# Patient Record
Sex: Female | Born: 1995
Health system: Southern US, Community
[De-identification: ages and names within clinical notes are randomized; demographics above are authoritative.]

## PROBLEM LIST (undated history)

## (undated) HISTORY — PX: NO PAST SURGERIES: SHX2092

---
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
## 2015-05-31 ENCOUNTER — Other Ambulatory Visit: Payer: Self-pay | Admitting: Family Medicine

## 2015-05-31 DIAGNOSIS — Z3491 Encounter for supervision of normal pregnancy, unspecified, first trimester: Secondary | ICD-10-CM

## 2015-06-04 ENCOUNTER — Ambulatory Visit
Admission: RE | Admit: 2015-06-04 | Discharge: 2015-06-04 | Disposition: A | Discharge status: Home / Self Care | Payer: Medicaid Other | Source: Ambulatory Visit | Attending: Family Medicine | Admitting: Family Medicine

## 2015-06-04 DIAGNOSIS — Z3A19 19 weeks gestation of pregnancy: Secondary | ICD-10-CM | POA: Insufficient documentation

## 2015-06-04 DIAGNOSIS — Z3481 Encounter for supervision of other normal pregnancy, first trimester: Secondary | ICD-10-CM | POA: Diagnosis present

## 2015-06-04 DIAGNOSIS — Z3491 Encounter for supervision of normal pregnancy, unspecified, first trimester: Secondary | ICD-10-CM

## 2015-09-15 ENCOUNTER — Other Ambulatory Visit: Payer: Self-pay | Admitting: Family Medicine

## 2015-09-15 DIAGNOSIS — O365930 Maternal care for other known or suspected poor fetal growth, third trimester, not applicable or unspecified: Secondary | ICD-10-CM

## 2015-09-16 ENCOUNTER — Ambulatory Visit
Admission: RE | Admit: 2015-09-16 | Discharge: 2015-09-16 | Disposition: A | Discharge status: Home / Self Care | Payer: Medicaid Other | Source: Ambulatory Visit | Attending: Family Medicine | Admitting: Family Medicine

## 2015-09-16 DIAGNOSIS — Z3A34 34 weeks gestation of pregnancy: Secondary | ICD-10-CM | POA: Diagnosis not present

## 2015-09-16 DIAGNOSIS — O365930 Maternal care for other known or suspected poor fetal growth, third trimester, not applicable or unspecified: Secondary | ICD-10-CM | POA: Diagnosis present

## 2016-05-08 NOTE — L&D Delivery Note (Addendum)
Date of delivery: 09/21/16 Estimated Date of Delivery: 09/26/16 LMP 12/20/15 estimated EGA: 7769w2d  Delivery Note At 2:57 PM a viable female was delivered via Vaginal, Spontaneous Delivery (Presentation: direct OA ).  APGAR: 8, 9; weight 7lb 5oz, 3330g .   Placenta status: spontaneous, intact .  Cord: 3 vessels   with the following complications: none apparent.  Cord pH: not collected  Anesthesia:  none Episiotomy:  none Lacerations:  none Suture Repair: n/a Est. Blood Loss (mL):  200cc  Mom presented to L&D with advanced labor.  Progressed to complete, second stage: short.  delivery of fetal head with restitution to ROT.   Anterior then posterior shoulders delivered without difficulty.  Baby placed on mom's chest, and attended to by peds.  We sang happy birthday to baby Jaylene.  Cord was then clamped and cut when pulseless.  Placenta spontaneously delivered, intact.   IV pitocin given for hemorrhage prophylaxis.  Mom to postpartum.  Baby to Couplet care / Skin to Skin.  ----- Ranae Plumberhelsea Cierria Height, MD Attending Obstetrician and Gynecologist [facility], Department of OB/GYN [facility]

## 2016-07-17 ENCOUNTER — Other Ambulatory Visit: Payer: Self-pay | Admitting: Family Medicine

## 2016-07-17 DIAGNOSIS — Z331 Pregnant state, incidental: Secondary | ICD-10-CM

## 2016-07-21 ENCOUNTER — Ambulatory Visit
Admission: RE | Admit: 2016-07-21 | Discharge: 2016-07-21 | Disposition: A | Discharge status: Home / Self Care | Payer: Medicaid Other | Source: Ambulatory Visit | Attending: Family Medicine | Admitting: Family Medicine

## 2016-07-21 DIAGNOSIS — Z3A30 30 weeks gestation of pregnancy: Secondary | ICD-10-CM | POA: Insufficient documentation

## 2016-07-21 DIAGNOSIS — Z3689 Encounter for other specified antenatal screening: Secondary | ICD-10-CM | POA: Insufficient documentation

## 2016-07-21 DIAGNOSIS — Z331 Pregnant state, incidental: Secondary | ICD-10-CM

## 2016-09-21 ENCOUNTER — Inpatient Hospital Stay
Admission: EM | Admit: 2016-09-21 | Discharge: 2016-09-22 | DRG: 775 | Disposition: A | Discharge status: Home / Self Care | Payer: Medicaid Other | Attending: Obstetrics & Gynecology | Admitting: Obstetrics & Gynecology

## 2016-09-21 ENCOUNTER — Encounter: Payer: Self-pay | Admitting: *Deleted

## 2016-09-21 DIAGNOSIS — Z3A39 39 weeks gestation of pregnancy: Secondary | ICD-10-CM | POA: Diagnosis not present

## 2016-09-21 DIAGNOSIS — Z3493 Encounter for supervision of normal pregnancy, unspecified, third trimester: Secondary | ICD-10-CM | POA: Diagnosis present

## 2016-09-21 LAB — CBC WITH DIFFERENTIAL/PLATELET
BASOS ABS: 0 10*3/uL (ref 0–0.1)
Basophils Relative: 0 %
Eosinophils Absolute: 0 10*3/uL (ref 0–0.7)
Eosinophils Relative: 0 %
HEMATOCRIT: 33.9 % — AB (ref 35.0–47.0)
Hemoglobin: 11.5 g/dL — ABNORMAL LOW (ref 12.0–16.0)
LYMPHS PCT: 5 %
Lymphs Abs: 0.5 10*3/uL — ABNORMAL LOW (ref 1.0–3.6)
MCH: 29.2 pg (ref 26.0–34.0)
MCHC: 33.9 g/dL (ref 32.0–36.0)
MCV: 86.2 fL (ref 80.0–100.0)
MONO ABS: 0.3 10*3/uL (ref 0.2–0.9)
MONOS PCT: 2 %
NEUTROS ABS: 10.6 10*3/uL — AB (ref 1.4–6.5)
Neutrophils Relative %: 93 %
Platelets: 297 10*3/uL (ref 150–440)
RBC: 3.93 MIL/uL (ref 3.80–5.20)
RDW: 13.4 % (ref 11.5–14.5)
WBC: 11.4 10*3/uL — ABNORMAL HIGH (ref 3.6–11.0)

## 2016-09-21 LAB — CHLAMYDIA/NGC RT PCR (ARMC ONLY)
Chlamydia Tr: NOT DETECTED
N GONORRHOEAE: NOT DETECTED

## 2016-09-21 LAB — TYPE AND SCREEN
ABO/RH(D): A POS
Antibody Screen: NEGATIVE

## 2016-09-21 MED ORDER — OXYTOCIN 40 UNITS IN LACTATED RINGERS INFUSION - SIMPLE MED
INTRAVENOUS | Status: AC
  Filled 2016-09-21: qty 1000

## 2016-09-21 MED ORDER — COCONUT OIL OIL: 1.0000 "application " | TOPICAL_OIL | Status: DC | PRN

## 2016-09-21 MED ORDER — IBUPROFEN 600 MG PO TABS
600.0000 mg | ORAL_TABLET | Freq: Four times a day (QID) | ORAL | Status: DC
  Administered 2016-09-21 – 2016-09-22 (×4): 600 mg via ORAL

## 2016-09-21 MED ORDER — DIPHENHYDRAMINE HCL 25 MG PO CAPS: 25.0000 mg | ORAL_CAPSULE | Freq: Four times a day (QID) | ORAL | Status: DC | PRN

## 2016-09-21 MED ORDER — ONDANSETRON HCL 4 MG/2ML IJ SOLN: 4.0000 mg | INTRAMUSCULAR | Status: DC | PRN

## 2016-09-21 MED ORDER — OXYCODONE-ACETAMINOPHEN 5-325 MG PO TABS: 2.0000 | ORAL_TABLET | ORAL | Status: DC | PRN

## 2016-09-21 MED ORDER — LACTATED RINGERS IV SOLN: 500.0000 mL | INTRAVENOUS | Status: DC | PRN

## 2016-09-21 MED ORDER — METHYLERGONOVINE MALEATE 0.2 MG/ML IJ SOLN: 0.2000 mg | Freq: Once | INTRAMUSCULAR | Status: DC

## 2016-09-21 MED ORDER — IBUPROFEN 600 MG PO TABS
ORAL_TABLET | ORAL | Status: AC
  Filled 2016-09-21: qty 1

## 2016-09-21 MED ORDER — METHYLERGONOVINE MALEATE 0.2 MG/ML IJ SOLN
INTRAMUSCULAR | Status: AC
  Administered 2016-09-21: 15:00:00 via INTRAMUSCULAR
  Filled 2016-09-21: qty 1

## 2016-09-21 MED ORDER — DOCUSATE SODIUM 100 MG PO CAPS
100.0000 mg | ORAL_CAPSULE | Freq: Two times a day (BID) | ORAL | Status: DC
  Administered 2016-09-21 – 2016-09-22 (×2): 100 mg via ORAL
  Filled 2016-09-21 (×2): qty 1

## 2016-09-21 MED ORDER — ONDANSETRON HCL 4 MG/2ML IJ SOLN: 4.0000 mg | Freq: Four times a day (QID) | INTRAMUSCULAR | Status: DC | PRN

## 2016-09-21 MED ORDER — SOD CITRATE-CITRIC ACID 500-334 MG/5ML PO SOLN
30.0000 mL | ORAL | Status: DC | PRN
  Filled 2016-09-21: qty 30

## 2016-09-21 MED ORDER — OXYCODONE-ACETAMINOPHEN 5-325 MG PO TABS: 1.0000 | ORAL_TABLET | ORAL | Status: DC | PRN

## 2016-09-21 MED ORDER — HYDROCORTISONE 2.5 % RE CREA
TOPICAL_CREAM | RECTAL | Status: DC | PRN
  Filled 2016-09-21: qty 28.35

## 2016-09-21 MED ORDER — ACETAMINOPHEN 325 MG PO TABS: 650.0000 mg | ORAL_TABLET | ORAL | Status: DC | PRN

## 2016-09-21 MED ORDER — LIDOCAINE HCL (PF) 1 % IJ SOLN
INTRAMUSCULAR | Status: AC
  Filled 2016-09-21: qty 30

## 2016-09-21 MED ORDER — LIDOCAINE HCL (PF) 1 % IJ SOLN: 30.0000 mL | INTRAMUSCULAR | Status: DC | PRN

## 2016-09-21 MED ORDER — MISOPROSTOL 200 MCG PO TABS
ORAL_TABLET | ORAL | Status: AC
  Filled 2016-09-21: qty 4

## 2016-09-21 MED ORDER — WITCH HAZEL-GLYCERIN EX PADS: 1.0000 "application " | MEDICATED_PAD | CUTANEOUS | Status: DC | PRN

## 2016-09-21 MED ORDER — ONDANSETRON HCL 4 MG PO TABS: 4.0000 mg | ORAL_TABLET | ORAL | Status: DC | PRN

## 2016-09-21 MED ORDER — PRENATAL MULTIVITAMIN CH
1.0000 | ORAL_TABLET | Freq: Every day | ORAL | Status: DC
  Administered 2016-09-22: 1 via ORAL
  Filled 2016-09-21: qty 1

## 2016-09-21 MED ORDER — AMMONIA AROMATIC IN INHA
RESPIRATORY_TRACT | Status: AC
  Filled 2016-09-21: qty 10

## 2016-09-21 MED ORDER — ACETAMINOPHEN 500 MG PO TABS: 1000.0000 mg | ORAL_TABLET | Freq: Four times a day (QID) | ORAL | Status: DC | PRN

## 2016-09-21 MED ORDER — BENZOCAINE-MENTHOL 20-0.5 % EX AERO: 1.0000 "application " | INHALATION_SPRAY | CUTANEOUS | Status: DC | PRN

## 2016-09-21 MED ORDER — LACTATED RINGERS IV SOLN
INTRAVENOUS | Status: DC
  Administered 2016-09-21: 1000 mL via INTRAVENOUS

## 2016-09-21 MED ORDER — OXYTOCIN 40 UNITS IN LACTATED RINGERS INFUSION - SIMPLE MED: 2.5000 [IU]/h | INTRAVENOUS | Status: DC

## 2016-09-21 MED ORDER — LACTATED RINGERS IV SOLN: INTRAVENOUS | Status: DC

## 2016-09-21 MED ORDER — IBUPROFEN 600 MG PO TABS
600.0000 mg | ORAL_TABLET | Freq: Four times a day (QID) | ORAL | Status: DC | PRN
  Administered 2016-09-21: 600 mg via ORAL
  Filled 2016-09-21 (×4): qty 1

## 2016-09-21 MED ORDER — SIMETHICONE 80 MG PO CHEW: 80.0000 mg | CHEWABLE_TABLET | ORAL | Status: DC | PRN

## 2016-09-21 MED ORDER — OXYTOCIN BOLUS FROM INFUSION
500.0000 mL | Freq: Once | INTRAVENOUS | Status: AC
  Administered 2016-09-21: 500 mL via INTRAVENOUS

## 2016-09-21 NOTE — OB Triage Note (Signed)
Here witht complaint of contracitonns that started at 4am

## 2016-09-21 NOTE — H&P (Signed)
OB History & Physical   Written after delivery  History of Present Illness:  Chief Complaint:   HPI:  Kaitlin Parks is a 21 y.o. G2P1001 female at 6128w2d dated by LMP per patient    She presents to L&D with LABOR  +FM, + CTX, no LOF, no VB   RECORDS NOT AVAILABLE AT TIME OF ADMISSION   Maternal Medical History:  History reviewed. No pertinent past medical history.  History reviewed. No pertinent surgical history.  No Known Allergies  Prior to Admission medications   Not on File     Prenatal care site:  [facility]   Social History: She  reports that she has never smoked. She does not have any smokeless tobacco history on file. She reports that she does not drink alcohol or use drugs.  Family History: no gyn cancers  Review of Systems: A full review of systems was performed and negative except as noted in the HPI.     Physical Exam:  Vital Signs: BP 133/76 (BP Location: Left Arm)   Pulse (!) 110   Temp 98 F (36.7 C) (Oral)   Resp 18   Ht 5' (1.524 m)   Wt 58.5 kg (129 lb)   BMI 25.19 kg/m  General: no acute distress.  HEENT: normocephalic, atraumatic Heart: regular rate & rhythm.  No murmurs/rubs/gallops Lungs: clear to auscultation bilaterally, normal respiratory effort Abdomen: soft, gravid, non-tender;  EFW: 6.5lbs Pelvic:   External: Normal external female genitalia  Cervix: Dilation: Lip/rim / Effacement (%): 100 / Station: 0    Extremities: non-tender, symmetric, no edema bilaterally.  DTRs: 2+  Neurologic: Alert & oriented x 3.    No results found for this or any previous visit (from the past 24 hour(s)).  Pertinent Results:  Prenatal Labs: UNKNOWN AT TIME OF ADMISSION/DELIVERY  FHT: 145 mod + accels no decels TOCO: q2-174min SVE:  Dilation: Lip/rim / Effacement (%): 100 / Station: 0    Cephalic by leopolds    Assessment:  Kaitlin Parks is a 21 y.o. G2P1001 female at 4928w2d with labor.   Plan:  1. Admit to Labor  & Delivery 2. CBC, T&S, Clrs, IVF 3. GBS unknown, no risk factors.  4. Consents obtained. 5. Continuous efm/toco 6. Delivery imminent.  ----- Ranae Plumberhelsea Nakisha Chai, MD Attending Obstetrician and Gynecologist [facility], Department of OB/GYN [facility]

## 2016-09-22 LAB — CBC
HEMATOCRIT: 29.4 % — AB (ref 35.0–47.0)
HEMOGLOBIN: 10 g/dL — AB (ref 12.0–16.0)
MCH: 29.5 pg (ref 26.0–34.0)
MCHC: 33.9 g/dL (ref 32.0–36.0)
MCV: 87.1 fL (ref 80.0–100.0)
Platelets: 273 10*3/uL (ref 150–440)
RBC: 3.38 MIL/uL — AB (ref 3.80–5.20)
RDW: 13.6 % (ref 11.5–14.5)
WBC: 8.8 10*3/uL (ref 3.6–11.0)

## 2016-09-22 LAB — RPR: RPR: NONREACTIVE

## 2016-09-22 MED ORDER — MEASLES, MUMPS & RUBELLA VAC ~~LOC~~ INJ
0.5000 mL | INJECTION | Freq: Once | SUBCUTANEOUS | Status: AC
  Administered 2016-09-22: 0.5 mL via SUBCUTANEOUS
  Filled 2016-09-22: qty 0.5

## 2016-09-22 NOTE — Progress Notes (Signed)
Patient discharged home with infant and significant other. Discharge instructions, prescriptions and follow up appointment given to and reviewed with patient and significant other. Patient verbalized understanding. Escorted out via wheelchair by nursing staff.  

## 2016-09-22 NOTE — Progress Notes (Signed)
Post Partum Day 1 Subjective: I want to go home today  Objective: Blood pressure (!) 92/54, pulse 66, temperature 98 F (36.7 C), temperature source Oral, resp. rate 17, height 5' (1.524 m), weight 129 lb (58.5 kg), SpO2 99 %, unknown if currently breastfeeding.  Physical Exam:  General: alert, cooperative and appears stated age  Heart:S1S2, RRR, No M/R/G. Lungs:CTA bilat no W/R/R. Lochia: appropriate Uterine Fundus: firm DVT Evaluation: No evidence of DVT seen on physical exam.   Recent Labs  09/21/16 1417 09/22/16 0532  HGB 11.5* 10.0*  HCT 33.9* 29.4*  WBC 11.4* 8.8  PLT 297 273    Assessment/Plan: Discharge home   LOS: 1 day   Sharee Pimplearon W Daiel Strohecker 09/22/2016, 8:48 AM

## 2016-09-22 NOTE — Discharge Summary (Signed)
Obstetric Discharge Summary   Patient ID: Kaitlin Parks MRN: [id number] DOB/AGE: May 01, 1996 21 y.o.   Date of Admission: 09/21/2016  Date of Discharge:  09/22/16 Admitting Diagnosis: Onset of Labor at 2268w2d  Secondary Diagnosis: NOne  Mode of Delivery: normal spontaneous vaginal delivery     Discharge Diagnosis: Term delivery   Intrapartum Procedures: GBS neg,    Post partum procedures:None  Complications: None   Brief Hospital Course  Myrlene Brokerlejandra Bradly BienenstockMartinez is a [id number] who had a SVD on 09/21/16  for further details of this delivery, please refer to the delivery note.  Patient had an uncomplicated postpartum course.  By time of discharge on PPD#1 her pain was controlled on oral pain medications; she had appropriate lochia and was ambulating, voiding without difficulty and tolerating regular diet.  She was deemed stable for discharge to home.     Labs: CBC Latest Ref Rng & Units 09/22/2016 09/21/2016  WBC 3.6 - 11.0 K/uL 8.8 11.4(H)  Hemoglobin 12.0 - 16.0 g/dL 10.0(L) 11.5(L)  Hematocrit 35.0 - 47.0 % 29.4(L) 33.9(L)  Platelets 150 - 440 K/uL 273 297   A POS  Physical exam:  Blood pressure (!) 92/54, pulse 66, temperature 98 F (36.7 C), temperature source Oral, resp. rate 17, height 5' (1.524 m), weight 129 lb (58.5 kg), SpO2 99 %, unknown if currently breastfeeding. General: alert and no distress Heart: S1S2, RRR, No M/R/G. Lochia: appropriate Abdomen: soft, NT Uterine Fundus: firm Extremities: No evidence of DVT seen on physical exam. No lower extremity edema.  Discharge Instructions: Per After Visit Summary. Activity: Advance as tolerated. Pelvic rest for 6 weeks.  Also refer to After Visit Summary Diet: Regular Medications: Allergies as of 09/22/2016   No Known Allergies     Medication List    You have not been prescribed any medications.    Outpatient follow up: 6 weeks [facility] Postpartum contraception:at 6 weeks, pt undecided  Discharged Condition:  none  Discharged to: home   Newborn Data:  Baby Girl   Disposition:home with mother  Apgars: APGAR (1 MIN): 8   APGAR (5 MINS): 9   APGAR (10 MINS):    Sharee PimpleCaron W Audrey Thull, CNM 09/22/2016

## 2017-08-09 ENCOUNTER — Ambulatory Visit (INDEPENDENT_AMBULATORY_CARE_PROVIDER_SITE_OTHER): Payer: Medicaid Other | Admitting: Physician Assistant

## 2017-08-09 ENCOUNTER — Encounter: Payer: Self-pay | Admitting: Physician Assistant

## 2017-08-09 VITALS — BP 104/60 | HR 60 | Temp 98.5°F | Resp 16 | Ht 60.0 in | Wt 130.0 lb

## 2017-08-09 DIAGNOSIS — Z1329 Encounter for screening for other suspected endocrine disorder: Secondary | ICD-10-CM

## 2017-08-09 DIAGNOSIS — D649 Anemia, unspecified: Secondary | ICD-10-CM | POA: Diagnosis not present

## 2017-08-09 DIAGNOSIS — Z131 Encounter for screening for diabetes mellitus: Secondary | ICD-10-CM | POA: Diagnosis not present

## 2017-08-09 DIAGNOSIS — R079 Chest pain, unspecified: Secondary | ICD-10-CM | POA: Diagnosis not present

## 2017-08-09 DIAGNOSIS — Z13220 Encounter for screening for lipoid disorders: Secondary | ICD-10-CM

## 2017-08-09 NOTE — Progress Notes (Signed)
Patient: Kaitlin Parks Female    DOB: 1995/08/24   22 y.o.   MRN: [id number] Visit Date: 08/09/2017  Today's Provider: Trey SailorsAdriana M Artemisa Sladek, PA-C   Chief Complaint  Patient presents with  . Establish Care  . Chest Pain   Subjective:    Kaitlin Nailerlejandra Yureli Snellings is a 22 y/o woman presenting today to establish care. 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 She is living in [place] with her parents. She has two children, ages 2 and 1. She has been in a relationship for four years, sexually active. She has a mirena IUD and does not have concern for STI.  She reports having a PAP one or two years ago with one of her pregnancies.  She has had chest pain in the past, pertinent info below.  She reports her mom had a lump removed from her breast which was not cancerous, but that she is taking medication so that it does not turn into cancer.  Chest Pain   This is a recurrent problem. The current episode started 1 to 4 weeks ago (Happened 07/15/2017; [facility]  She states they did an EKG and it was normal.  She was prescribed Naproxen 500mg  which helped with the pain.). The problem has been resolved. The pain does not radiate. Pertinent negatives include no abdominal pain, back pain, claudication, cough, diaphoresis, dizziness, exertional chest pressure, fever, headaches, hemoptysis, irregular heartbeat, leg pain, lower extremity edema, malaise/fatigue, nausea, near-syncope, numbness, orthopnea, palpitations, PND, shortness of breath, sputum production, syncope, vomiting or weakness.       No Known Allergies  No current outpatient medications on file.  Review of Systems  Constitutional: Negative.  Negative for diaphoresis, fever and malaise/fatigue.  HENT: Negative.   Eyes: Negative.   Respiratory: Negative.  Negative for cough, hemoptysis, sputum production and shortness of breath.   Cardiovascular: Positive for chest pain. Negative for palpitations,  orthopnea, claudication, syncope, PND and near-syncope.  Gastrointestinal: Negative.  Negative for abdominal pain, nausea and vomiting.  Endocrine: Negative.   Genitourinary: Negative.   Musculoskeletal: Negative.  Negative for back pain.  Skin: Negative.   Allergic/Immunologic: Negative.   Neurological: Negative.  Negative for dizziness, weakness, numbness and headaches.  Hematological: Negative.   Psychiatric/Behavioral: Negative.    Family History  Problem Relation Age of Onset  . Healthy Mother   . Healthy Father    Past Surgical History:  Procedure Laterality Date  . NO PAST SURGERIES      Social History   Tobacco Use  . Smoking status: Never Smoker  . Smokeless tobacco: Never Used  Substance Use Topics  . Alcohol use: No   Objective:   BP 104/60 (BP Location: Right Arm, Patient Position: Sitting, Cuff Size: Normal)   Pulse 60   Temp 98.5 F (36.9 C) (Oral)   Resp 16   Ht 5' (1.524 m)   Wt 130 lb (59 kg)   BMI 25.39 kg/m  Vitals:   08/09/17 0919  BP: 104/60  Pulse: 60  Resp: 16  Temp: 98.5 F (36.9 C)  TempSrc: Oral  Weight: 130 lb (59 kg)  Height: 5' (1.524 m)     Physical Exam  Constitutional: She is oriented to person, place, and time. She appears well-developed and well-nourished.  Cardiovascular: Normal rate and regular rhythm.  Pulmonary/Chest: Effort normal and breath sounds normal.  Abdominal: Soft. Bowel sounds are normal.  Neurological: She is alert and oriented to  person, place, and time.  Skin: Skin is warm and dry.  Psychiatric: She has a normal mood and affect. Her behavior is normal.        Assessment & Plan:     1. Chest pain, unspecified type  Think this is likely MSK chest pain due to infrequent occurrences and resolution with Naproxen. She can monitor this for now, I can refer to cardiology for reassurance if she desires but I think this is unlikely to be cardiac etiology.  2. Anemia, unspecified type  - CBC With  Differential  3. Thyroid disorder screening  - TSH  4. Diabetes mellitus screening  - Comprehensive Metabolic Panel (CMET)  5. Screening cholesterol level  - Lipid Profile  Return in about 1 year (around 08/10/2018) for CPE.  The entirety of the information documented in the History of Present Illness, Review of Systems and Physical Exam were personally obtained by me. Portions of this information were initially documented by Kavin Leech, CMA and reviewed by me for thoroughness and accuracy.         Trey Sailors, PA-C  [facility]

## 2017-08-09 NOTE — Patient Instructions (Signed)

## 2017-08-10 LAB — COMPREHENSIVE METABOLIC PANEL
ALT: 10 IU/L (ref 0–32)
AST: 11 IU/L (ref 0–40)
Albumin/Globulin Ratio: 1.5 (ref 1.2–2.2)
Albumin: 4.5 g/dL (ref 3.5–5.5)
Alkaline Phosphatase: 86 IU/L (ref 39–117)
BUN/Creatinine Ratio: 24 — ABNORMAL HIGH (ref 9–23)
BUN: 15 mg/dL (ref 6–20)
Bilirubin Total: 0.7 mg/dL (ref 0.0–1.2)
CO2: 21 mmol/L (ref 20–29)
Calcium: 9.3 mg/dL (ref 8.7–10.2)
Chloride: 103 mmol/L (ref 96–106)
Creatinine, Ser: 0.63 mg/dL (ref 0.57–1.00)
GFR calc Af Amer: 146 mL/min/{1.73_m2} (ref >59)
GFR calc non Af Amer: 127 mL/min/{1.73_m2} (ref >59)
Globulin, Total: 3 g/dL (ref 1.5–4.5)
Glucose: 91 mg/dL (ref 65–99)
Potassium: 4 mmol/L (ref 3.5–5.2)
Sodium: 140 mmol/L (ref 134–144)
Total Protein: 7.5 g/dL (ref 6.0–8.5)

## 2017-08-10 LAB — CBC WITH DIFFERENTIAL
Basophils Absolute: 0 10*3/uL (ref 0.0–0.2)
Basos: 0 %
EOS (ABSOLUTE): 0.1 10*3/uL (ref 0.0–0.4)
Eos: 1 %
Hematocrit: 35 % (ref 34.0–46.6)
Hemoglobin: 11.9 g/dL (ref 11.1–15.9)
Immature Grans (Abs): 0 10*3/uL (ref 0.0–0.1)
Immature Granulocytes: 0 %
Lymphocytes Absolute: 1.9 10*3/uL (ref 0.7–3.1)
Lymphs: 36 %
MCH: 29.8 pg (ref 26.6–33.0)
MCHC: 34 g/dL (ref 31.5–35.7)
MCV: 88 fL (ref 79–97)
Monocytes Absolute: 0.5 10*3/uL (ref 0.1–0.9)
Monocytes: 9 %
Neutrophils Absolute: 2.8 10*3/uL (ref 1.4–7.0)
Neutrophils: 54 %
RBC: 3.99 x10E6/uL (ref 3.77–5.28)
RDW: 12.7 % (ref 12.3–15.4)
WBC: 5.3 10*3/uL (ref 3.4–10.8)

## 2017-08-10 LAB — LIPID PANEL
Chol/HDL Ratio: 2.3 ratio (ref 0.0–4.4)
Cholesterol, Total: 103 mg/dL (ref 100–199)
HDL: 45 mg/dL (ref >39)
LDL Calculated: 51 mg/dL (ref 0–99)
Triglycerides: 33 mg/dL (ref 0–149)
VLDL Cholesterol Cal: 7 mg/dL (ref 5–40)

## 2017-08-10 LAB — TSH: TSH: 0.809 u[IU]/mL (ref 0.450–4.500)

## 2017-08-10 NOTE — Progress Notes (Signed)
Advised  ED 

## 2017-09-08 ENCOUNTER — Ambulatory Visit (INDEPENDENT_AMBULATORY_CARE_PROVIDER_SITE_OTHER): Payer: Medicaid Other | Admitting: Physician Assistant

## 2017-09-08 ENCOUNTER — Encounter: Payer: Self-pay | Admitting: Physician Assistant

## 2017-09-08 VITALS — BP 94/62 | HR 93 | Temp 98.8°F | Resp 16 | Wt 128.0 lb

## 2017-09-08 DIAGNOSIS — J029 Acute pharyngitis, unspecified: Secondary | ICD-10-CM

## 2017-09-08 DIAGNOSIS — J069 Acute upper respiratory infection, unspecified: Secondary | ICD-10-CM

## 2017-09-08 LAB — POCT RAPID STREP A (OFFICE): Rapid Strep A Screen: NEGATIVE

## 2017-09-08 MED ORDER — AMOXICILLIN-POT CLAVULANATE 875-125 MG PO TABS: 1.0000 | ORAL_TABLET | Freq: Two times a day (BID) | ORAL | 0 refills | Status: DC

## 2017-09-08 NOTE — Progress Notes (Signed)
Patient: Kaitlin Parks Female    DOB: [birth year]   22 y.o.   MRN: [id number] Visit Date: 09/08/2017  Today's Provider: Margaretann Loveless, PA-C   Chief Complaint  Patient presents with  . URI    x 5 days   Subjective:    URI   This is a new problem. Episode onset: 5 days ago. The problem has been gradually worsening. There has been no fever. Associated symptoms include coughing (productive with green sputum), ear pain (left ear), a plugged ear sensation, rhinorrhea, sneezing and a sore throat. Pertinent negatives include no abdominal pain, chest pain, headaches, nausea, vomiting or wheezing. She has tried antihistamine for the symptoms. The treatment provided mild relief.      No Known Allergies  No current outpatient medications on file.  Review of Systems  Constitutional: Positive for fatigue. Negative for appetite change, chills, diaphoresis and fever.  HENT: Positive for ear pain (left ear), rhinorrhea, sneezing, sore throat and voice change.   Respiratory: Positive for cough (productive with green sputum). Negative for chest tightness, shortness of breath and wheezing.   Cardiovascular: Negative for chest pain and palpitations.  Gastrointestinal: Negative for abdominal pain, nausea and vomiting.  Neurological: Negative for dizziness, weakness and headaches.    Social History   Tobacco Use  . Smoking status: Never Smoker  . Smokeless tobacco: Never Used  Substance Use Topics  . Alcohol use: No   Objective:   BP 94/62 (BP Location: Right Arm, Patient Position: Sitting, Cuff Size: Normal)   Pulse 93   Temp 98.8 F (37.1 C) (Oral)   Resp 16   Wt 128 lb (58.1 kg)   SpO2 98% Comment: room air  BMI 25.00 kg/m  There were no vitals filed for this visit.   Physical Exam  Constitutional: She appears well-developed and well-nourished. No distress.  HENT:  Head: Normocephalic and atraumatic.  Right Ear: Hearing, external ear and ear canal normal. A  middle ear effusion is present.  Left Ear: Hearing, external ear and ear canal normal. A middle ear effusion is present.  Nose: Nose normal. Right sinus exhibits no maxillary sinus tenderness and no frontal sinus tenderness. Left sinus exhibits no maxillary sinus tenderness and no frontal sinus tenderness.  Mouth/Throat: Uvula is midline, oropharynx is clear and moist and mucous membranes are normal. No oropharyngeal exudate, posterior oropharyngeal edema or posterior oropharyngeal erythema.  Eyes: Pupils are equal, round, and reactive to light. Conjunctivae and EOM are normal. Right eye exhibits no discharge. Left eye exhibits no discharge.  Neck: Normal range of motion. Neck supple. No JVD present. No tracheal deviation present. No Brudzinski's sign and no Kernig's sign noted. No thyromegaly present.  Cardiovascular: Normal rate, regular rhythm and normal heart sounds. Exam reveals no gallop and no friction rub.  No murmur heard. Pulmonary/Chest: Effort normal and breath sounds normal. No stridor. No respiratory distress. She has no wheezes. She has no rales. She exhibits no tenderness.  Lymphadenopathy:    She has no cervical adenopathy.  Skin: Skin is warm and dry.  Vitals reviewed.       Assessment & Plan:     1. Upper respiratory tract infection, unspecified type Worsening symptoms that have not responded to OTC medications. Will give augmentin as below. Continue allergy medications. Stay well hydrated and get plenty of rest. Call if no symptom improvement or if symptoms worsen. - amoxicillin-clavulanate (AUGMENTIN) 875-125 MG tablet; Take 1 tablet by mouth 2 (two) times  daily.  Dispense: 20 tablet; Refill: 0  2. Sore throat Strep negative. - POCT rapid strep A       Margaretann Loveless, PA-C  [facility]

## 2017-09-11 ENCOUNTER — Telehealth: Payer: Self-pay | Admitting: Physician Assistant

## 2017-09-11 DIAGNOSIS — T3695XA Adverse effect of unspecified systemic antibiotic, initial encounter: Principal | ICD-10-CM

## 2017-09-11 DIAGNOSIS — B379 Candidiasis, unspecified: Secondary | ICD-10-CM

## 2017-09-11 NOTE — Telephone Encounter (Signed)
Pt was in last week and seen Boneta Lucks and prescribed augmentin and if she started having yeast infection symptoms to call her.  Pt states she is having yeast infection symptoms now  She uses [facility]  Pt's call back Is [phone]

## 2017-09-11 NOTE — Telephone Encounter (Signed)
Please review. Patient advised that Antony Contras not in the office currently.

## 2017-09-12 MED ORDER — FLUCONAZOLE 150 MG PO TABS: 150.0000 mg | ORAL_TABLET | Freq: Once | ORAL | 0 refills | Status: AC

## 2017-09-12 NOTE — Telephone Encounter (Signed)
Sent in diflucan

## 2018-01-24 IMAGING — US US OB COMP +14 WK
1 series · 14 of 28 positions shown · non-contrast
Comparison: none

CLINICAL DATA: Pregnancy.

EXAM:
OBSTETRICAL ULTRASOUND >14 WKS

[Series 1: us ob comp +14 wk · 0.21mm/px · 14 of 172 slices shown]
[im 7/172]
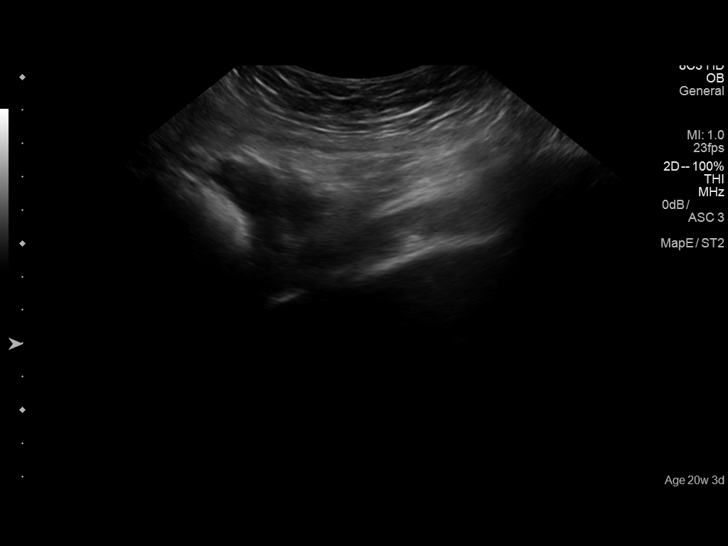
[im 20/172]
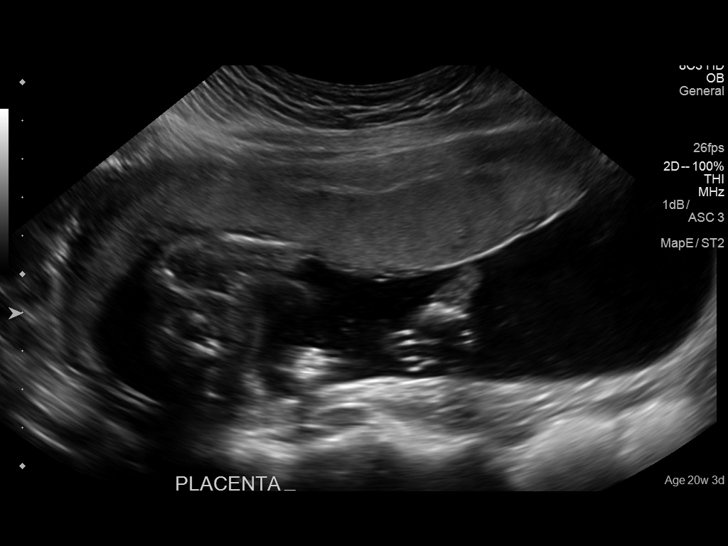
[im 32/172]
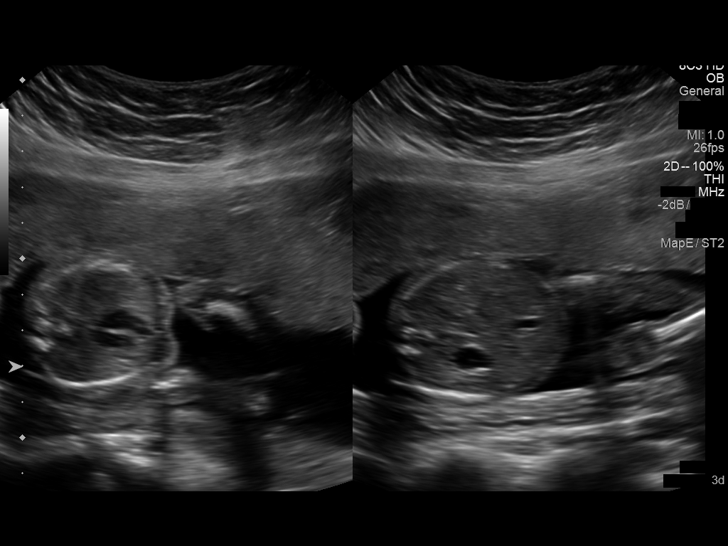
[im 45/172]
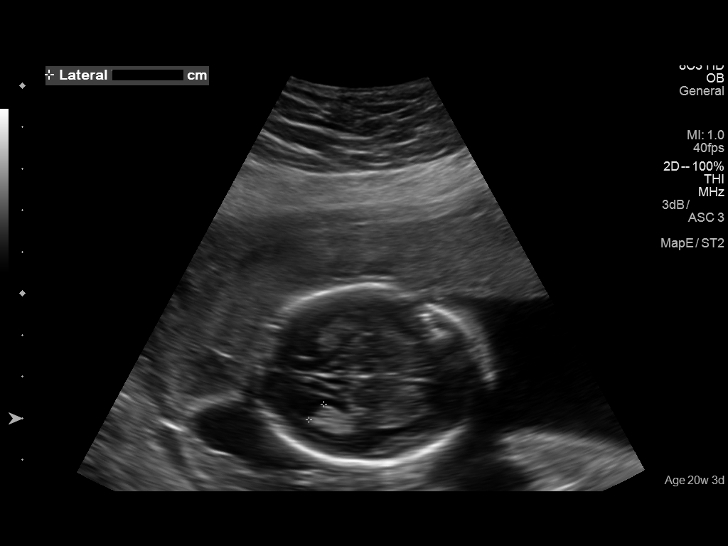
[im 58/172]
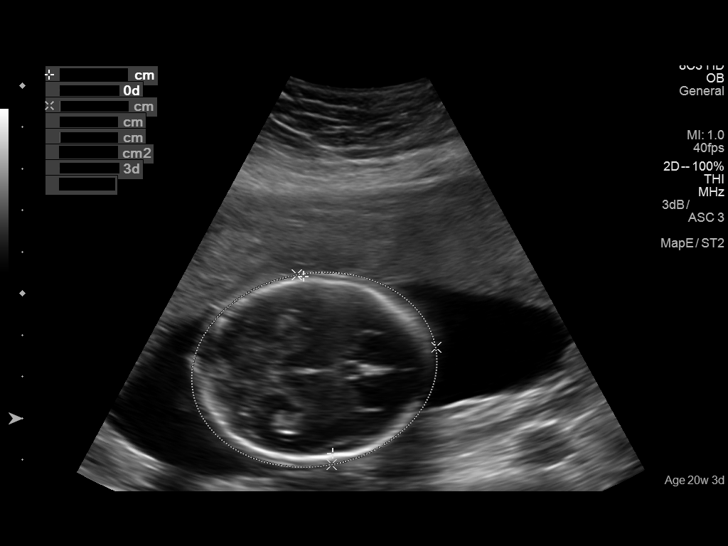
[im 70/172]
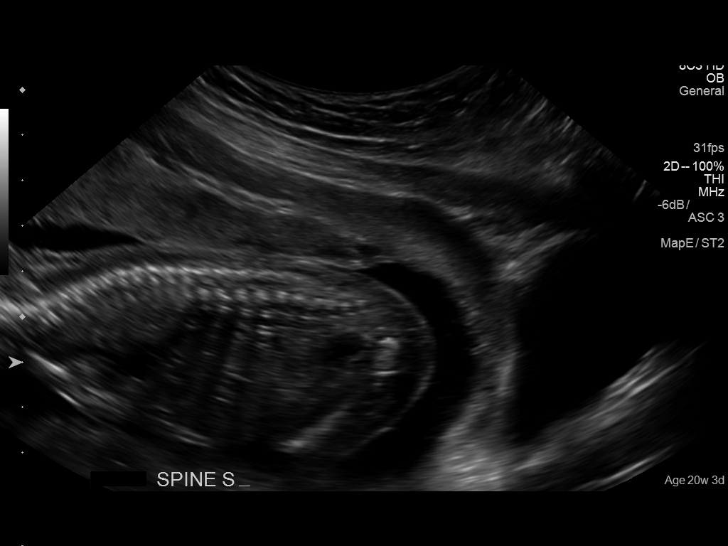
[im 83/172]
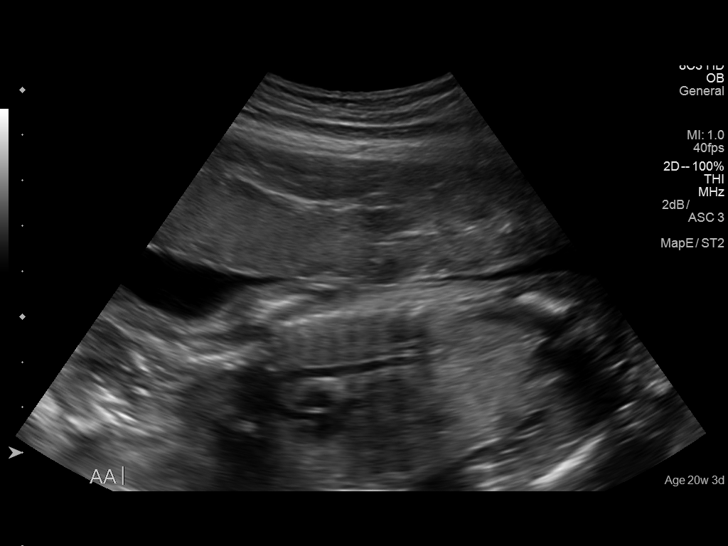
[im 96/172]
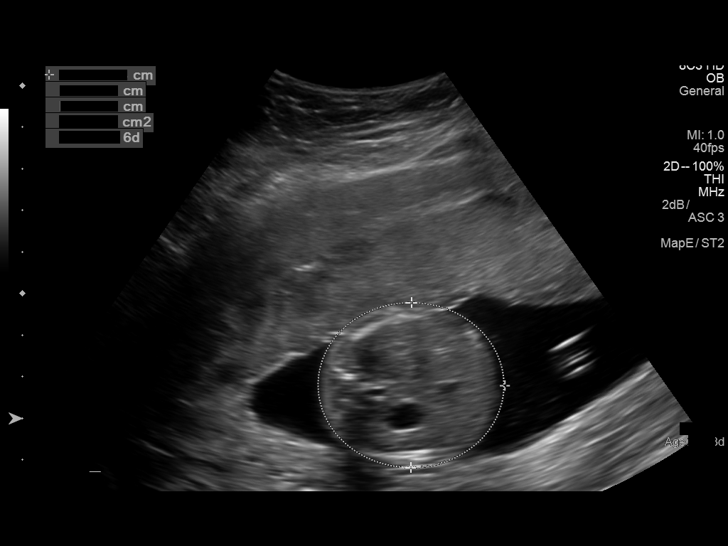
[im 108/172]
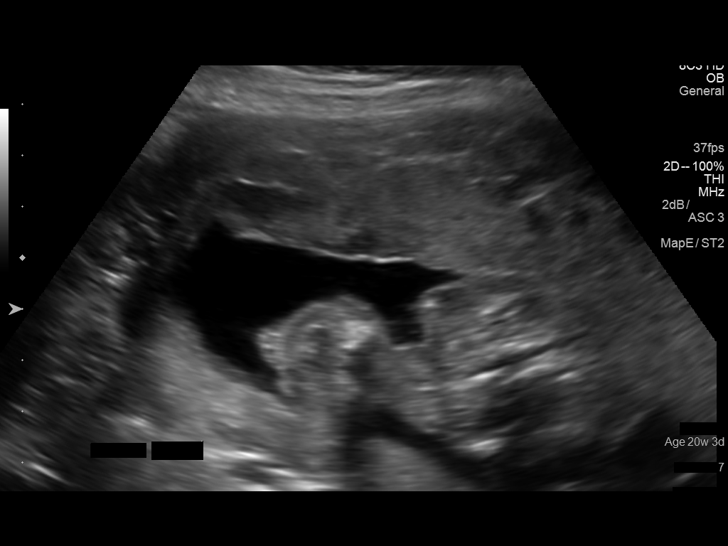
[im 121/172]
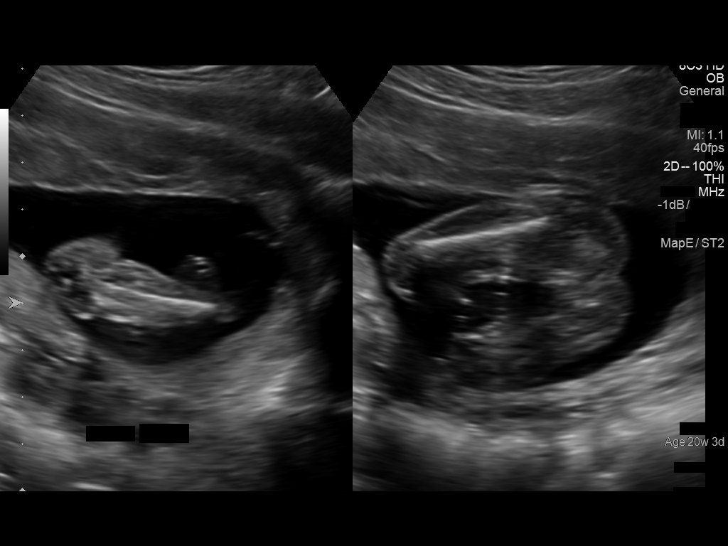
[im 134/172]
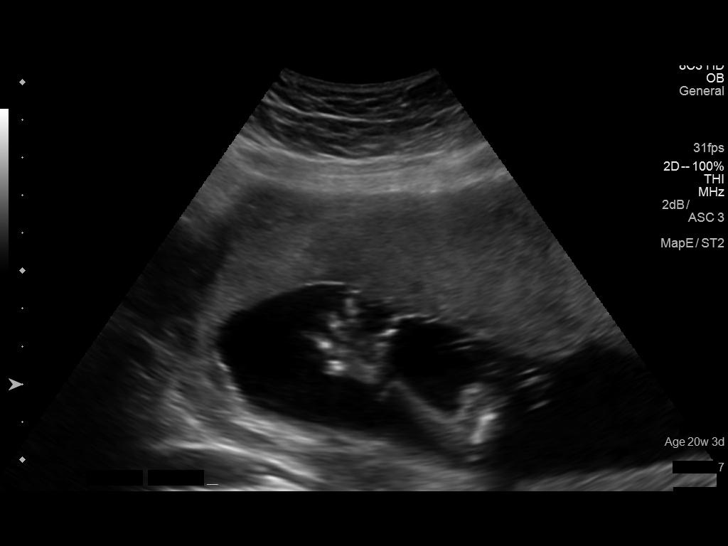
[im 146/172]
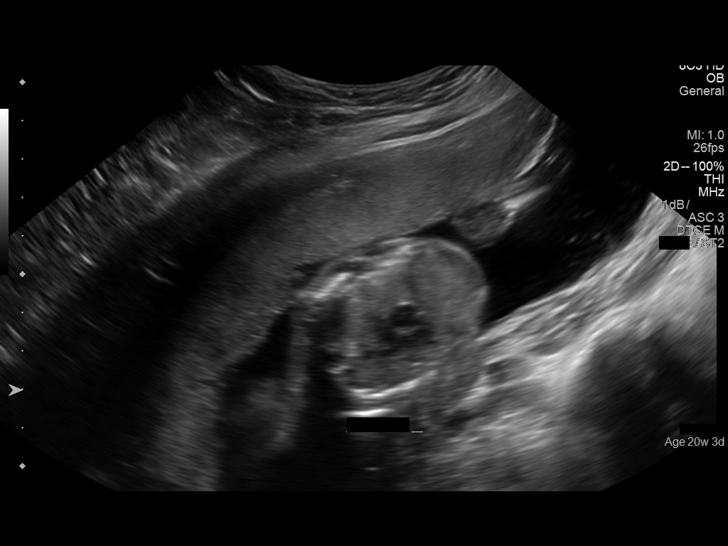
[im 159/172]
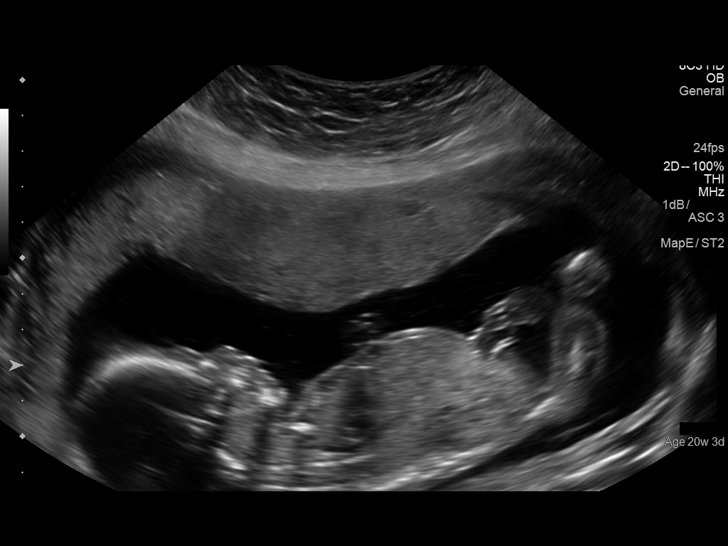
[im 172/172]
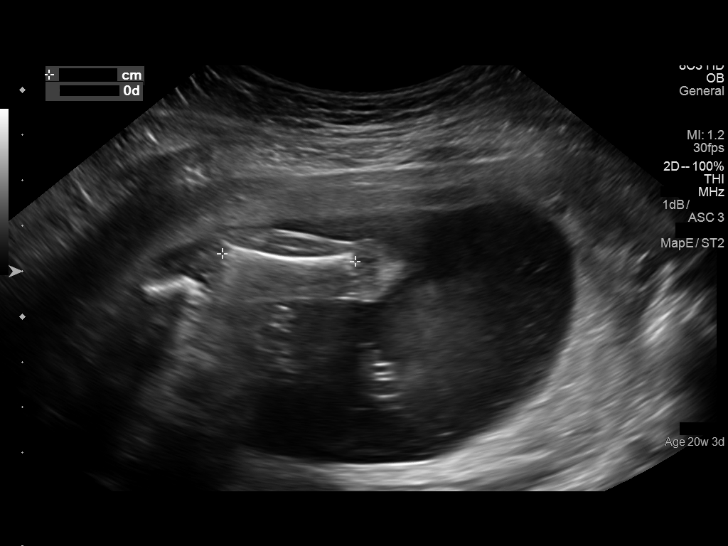

[14 of 28 positions shown; findings below may reference images not displayed]

FINDINGS: Number of Fetuses: 1

Heart Rate:  155 bpm

Movement: Present

Presentation: Breech

Previa: No

Placental Location: Anterior

Amniotic Fluid (Subjective): Normal

Amniotic Fluid (Objective):

Vertical pocket 4.7cm

FETAL BIOMETRY

BPD:  4.3cm 19w 0d

HC:    16.9cm  19w   4d

AC:   13.6cm  19w   0d

FL:   2.9cm  19w   0d

Current Mean GA: 19w 2d              US EDC: 10/27/2015

FETAL ANATOMY

Lateral Ventricles: Visualized

Thalami/CSP: Visualized

Posterior Fossa:  Visualized

Nuchal Region: Visualized    NFT= 4.1mm

Upper Lip: Visualized

Spine: Visualized

4 Chamber Heart on Left: Visualized

LVOT: Visualized

RVOT: Visualized

Stomach on Left: Visualized

3 Vessel Cord: Visualized

Cord Insertion site: Visualized

Kidneys: Visualized

Bladder: Visualized

Extremities: Visualized

Technically difficult due to: Fetal position. Fluid collection
presenting over the images labeled right adnexa is intrauterine
fluid.

Maternal Findings:

Cervix:  3.3 cm and closed.
IMPRESSION: Single viable intrauterine pregnancy at 19 weeks 2 days in breech
presentation.

## 2018-05-08 IMAGING — US US OB FOLLOW-UP
1 series · 13 of 28 positions shown · non-contrast
Comparison: none

CLINICAL DATA: OB follow-up examination, clinically suspect poor
fetal growth.

EXAM:
OBSTETRIC 14+ WK ULTRASOUND FOLLOW-UP

[Series 1: us ob follow-up · 0.23mm/px · 13 of 69 slices shown]
[im 3/69]
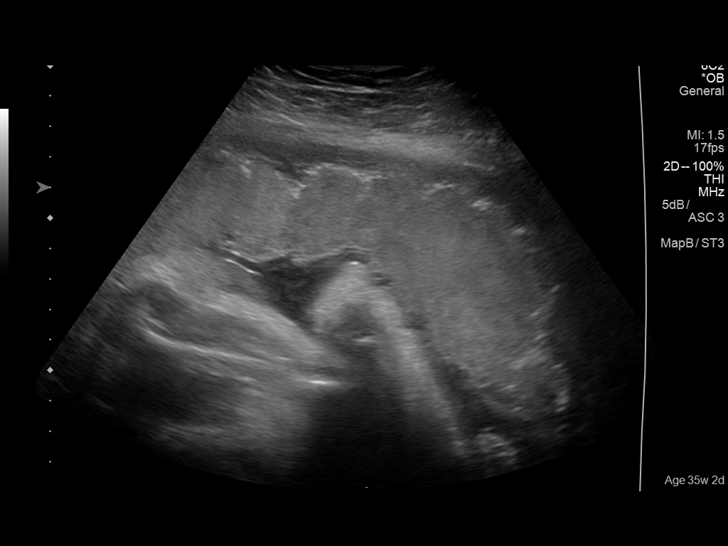
[im 8/69]
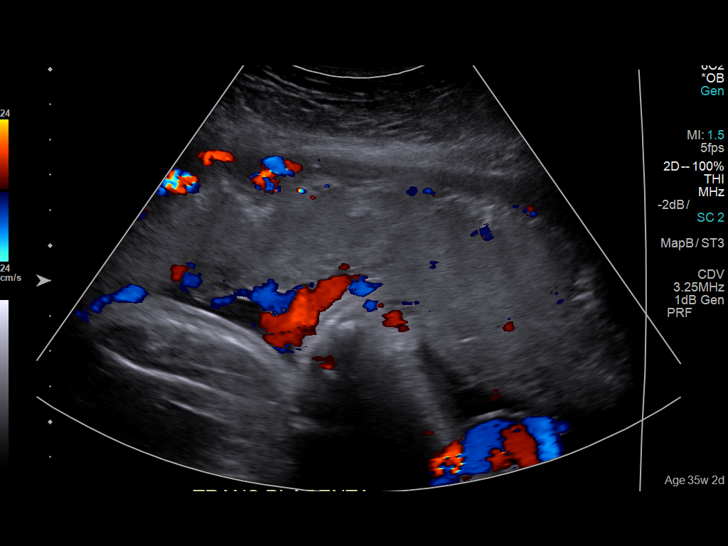
[im 13/69]
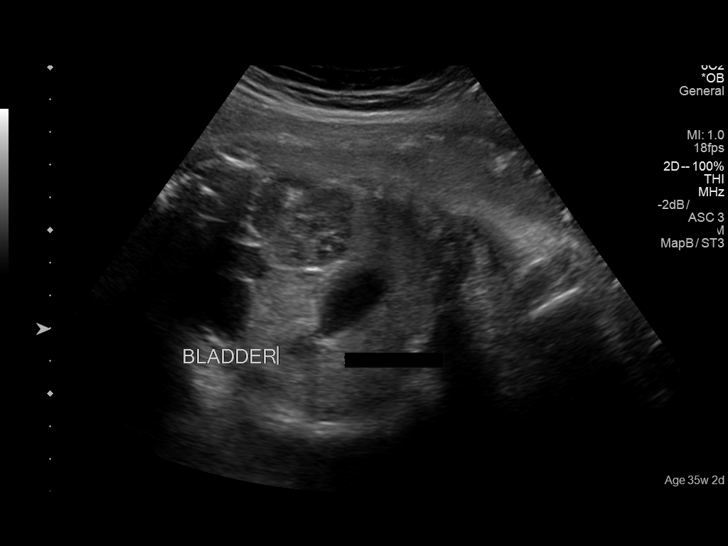
[im 18/69]
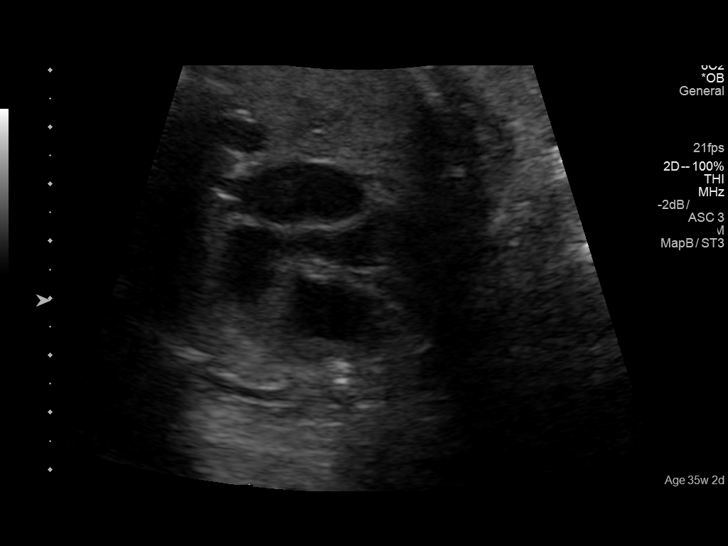
[im 23/69]
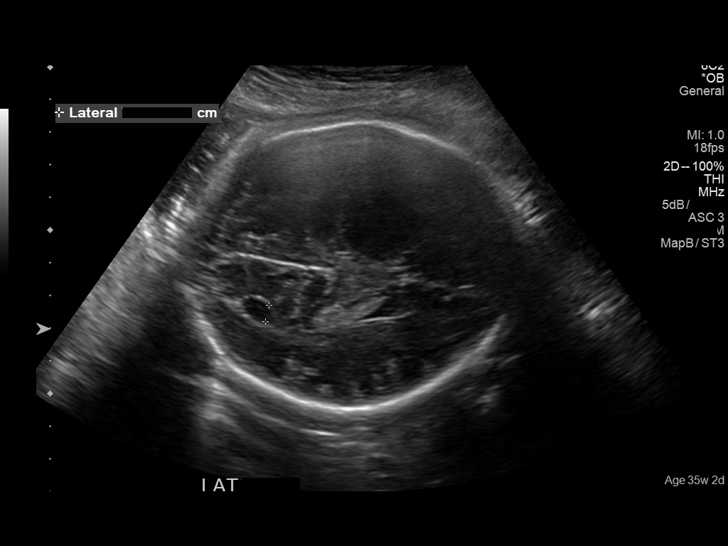
[im 28/69]
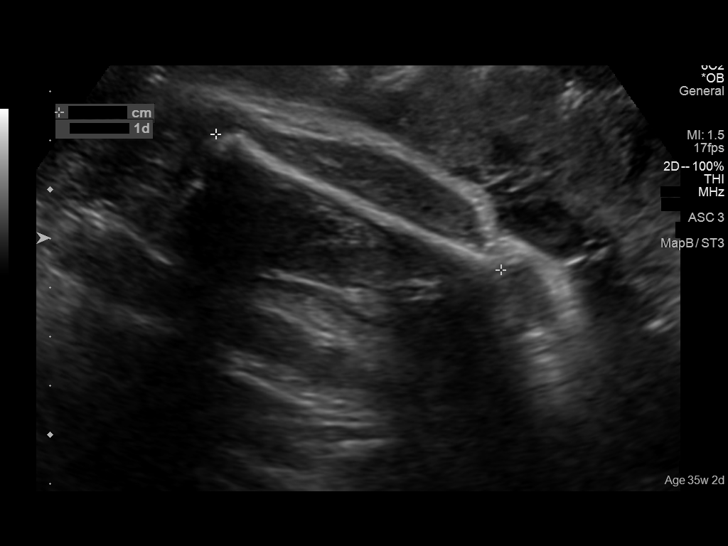
[im 36/69]
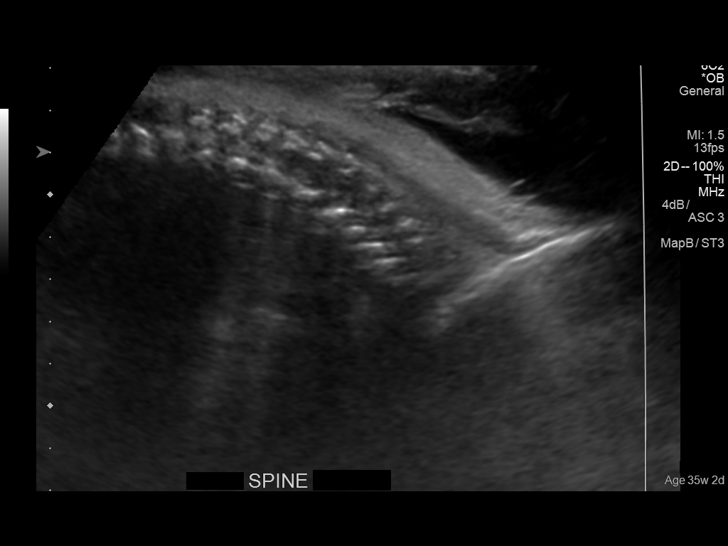
[im 41/69]
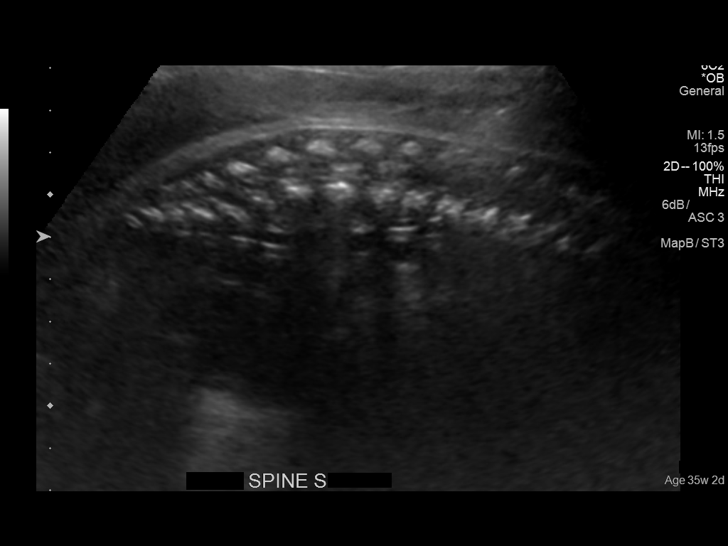
[im 46/69]
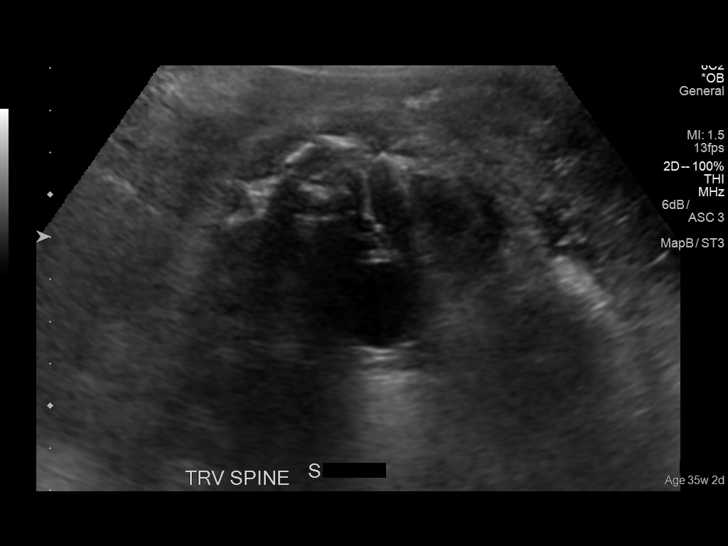
[im 51/69]
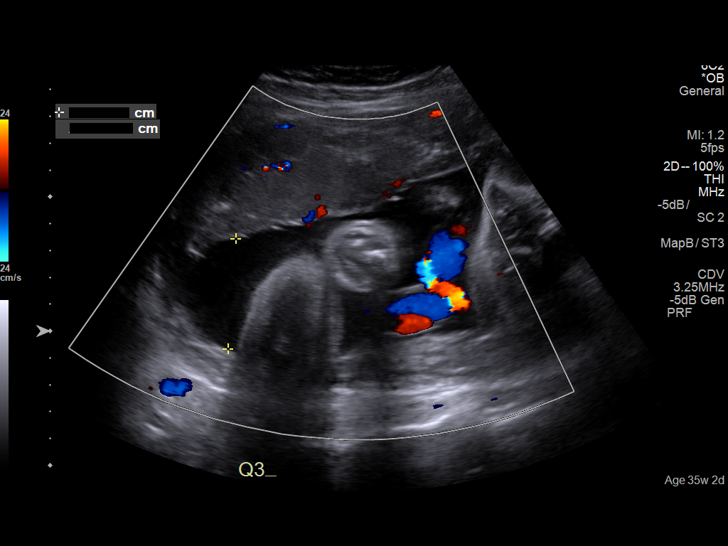
[im 56/69]
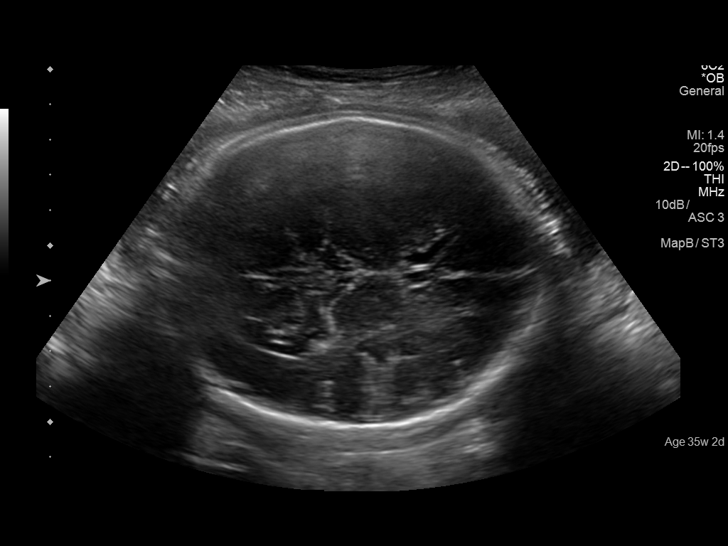
[im 61/69]
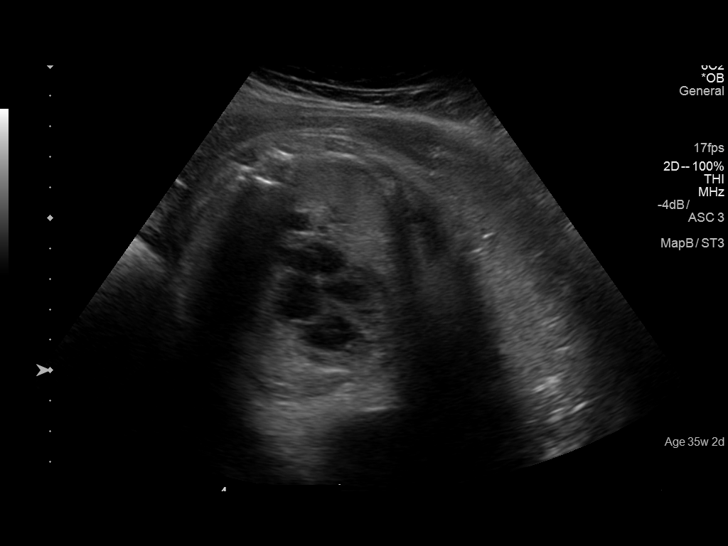
[im 66/69]
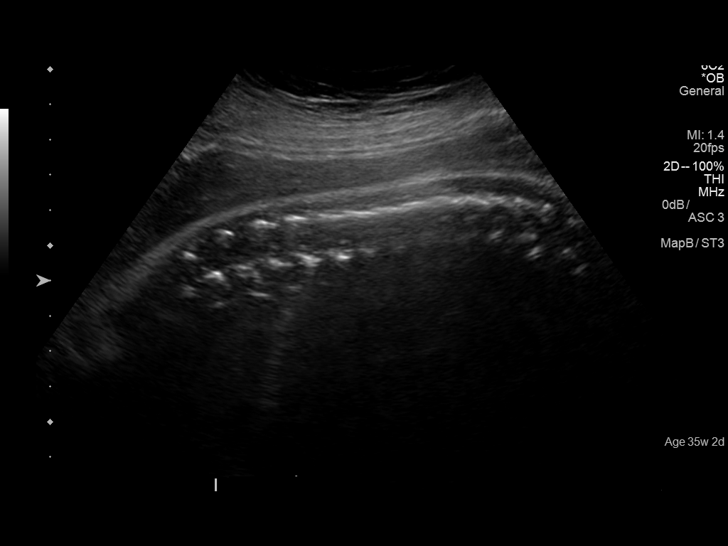

[13 of 28 positions shown; findings below may reference images not displayed]

FINDINGS: Number of Fetuses: 1

Heart Rate:  131 bpm

Movement: Present

Presentation: Cephalic

Previa: None

Placental Location: Anterior and partially fundal. The distance from
the lower placental segment to the internal cervical os is 10.8 cm

Amniotic Fluid (Subjective): Normal

Amniotic Fluid (Objective):  Normal

AFI 13 cm (5%ile= 7.9 cm, 95%= 24.9 cm for 35 wks)

FETAL BIOMETRY

BPD:  8.72cm 35w   1d

HC:    31.95cm  36w   0d

AC:   31.55cm  35w   3d

FL:   6.42cm  33w   1d

Current Mean GA: 34w 4d          US EDC: October 24, 2015

Estimated Fetal Weight:  2540g 36%ile

FETAL ANATOMY

Lateral Ventricles: Visualized

Thalami/CSP: Visualized

Posterior Fossa:  Visualized

Nuchal Region: Previously visualized

Upper Lip: Previously visualized

Spine: Previously visualized

4 Chamber Heart on Left: Visualized

LVOT: Visualized

RVOT: Visualized

Stomach on Left: Visualized

3 Vessel Cord: Previously visualized

Cord Insertion site: Previously visualized

Kidneys: Visualized

Bladder: Visualized

Extremities: Previously visualized

Sex: Male genitalia previously visualized.

Technically difficult due to: Fetal position

MATERNAL FINDINGS:

Cervix: n/a
IMPRESSION: 1. There is a single viable intrauterine gestational with estimated
age of 34 weeks 4 days with estimated date of confinement October 24, 2015 which is approximately 3 days more mature than that
predicted on the previous study. The estimated fetal weight is 5635
g which is the 36th percentile. No fetal anomalies are observed.
2. The placenta is anterior and partially fundal without evidence of
previa. The amniotic fluid volume is estimated to be normal.

## 2018-06-05 ENCOUNTER — Ambulatory Visit: Payer: Medicaid Other | Admitting: Physician Assistant

## 2019-03-13 IMAGING — US US OB COMP +14 WK
1 series · 14 of 28 positions shown · non-contrast
Comparison: none

CLINICAL DATA: Pregnancy.  Evaluate anatomy.

EXAM:
OBSTETRICAL ULTRASOUND >14 WKS

[Series 1: us ob comp +14 wk · 0.20mm/px · 14 of 89 slices shown]
[im 4/89]
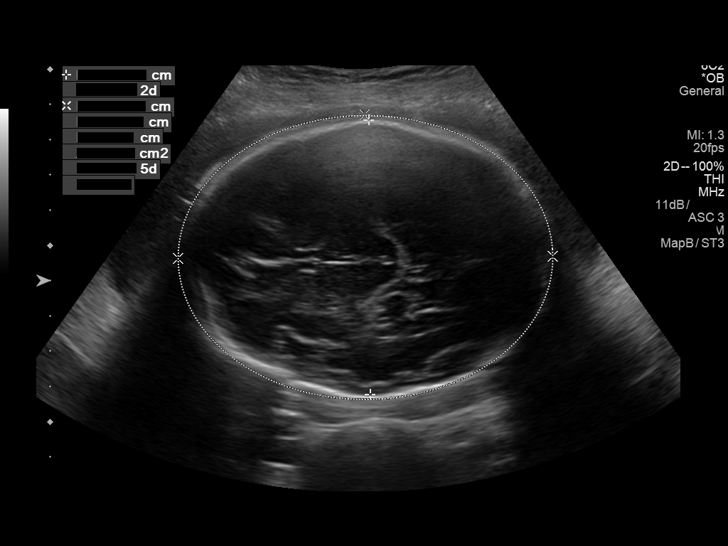
[im 10/89]
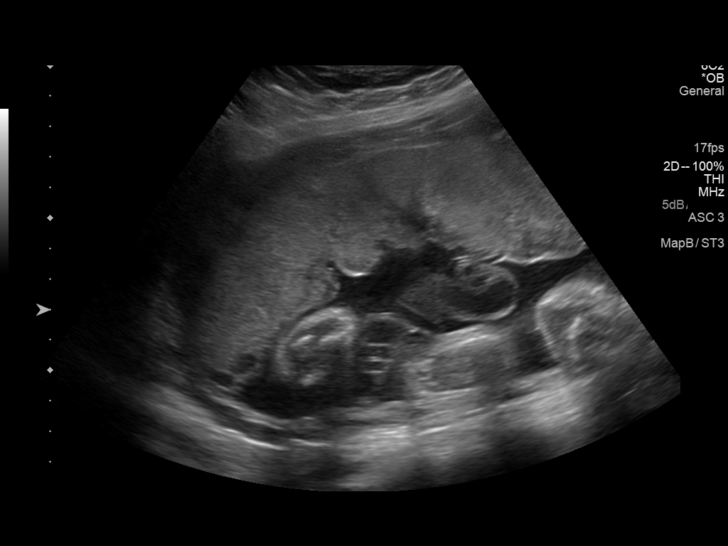
[im 17/89]
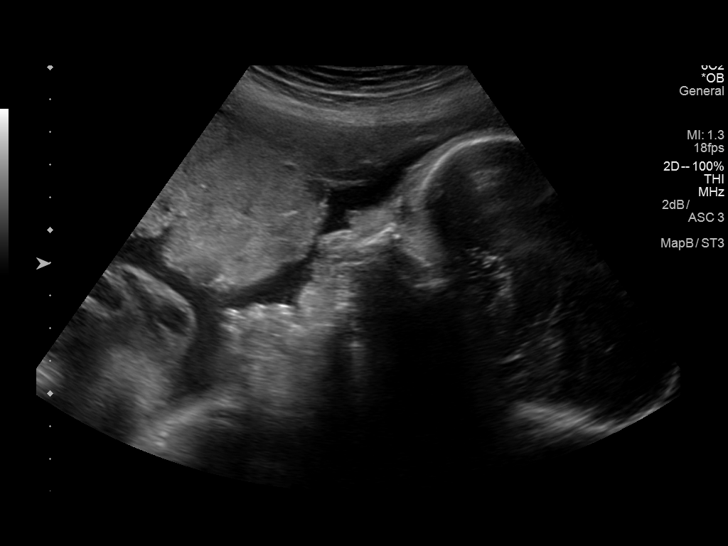
[im 23/89]
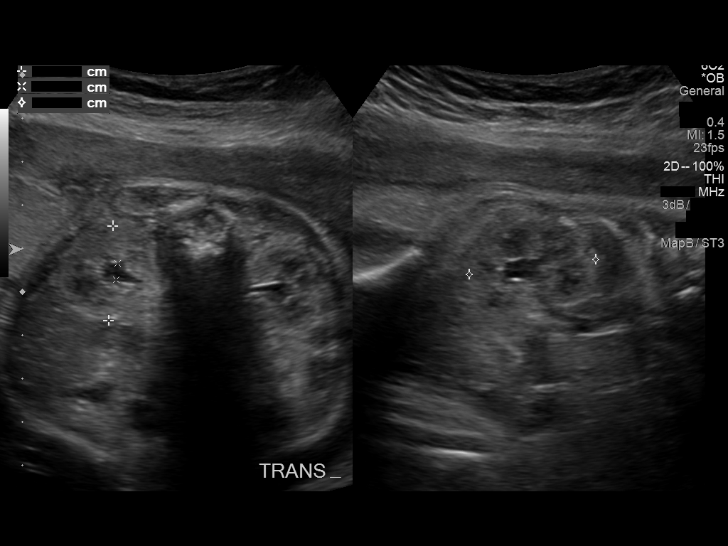
[im 30/89]
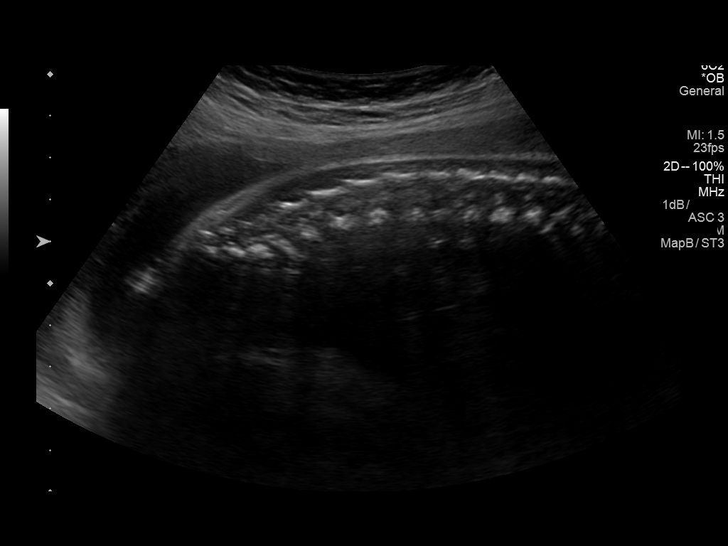
[im 36/89]
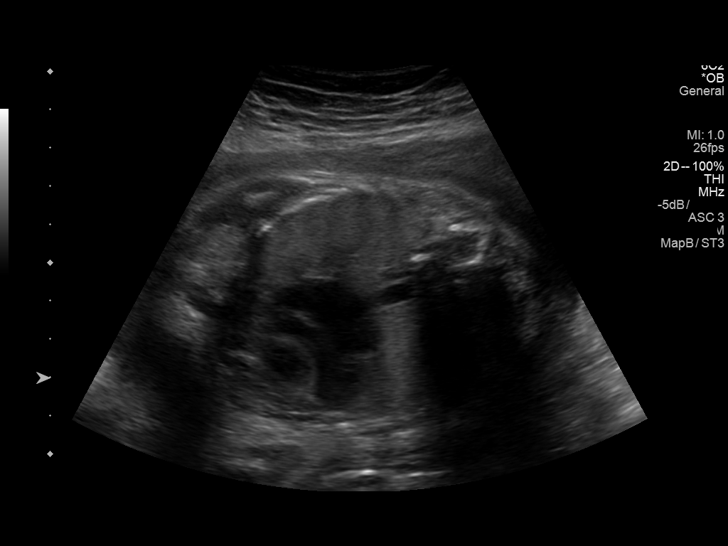
[im 43/89]
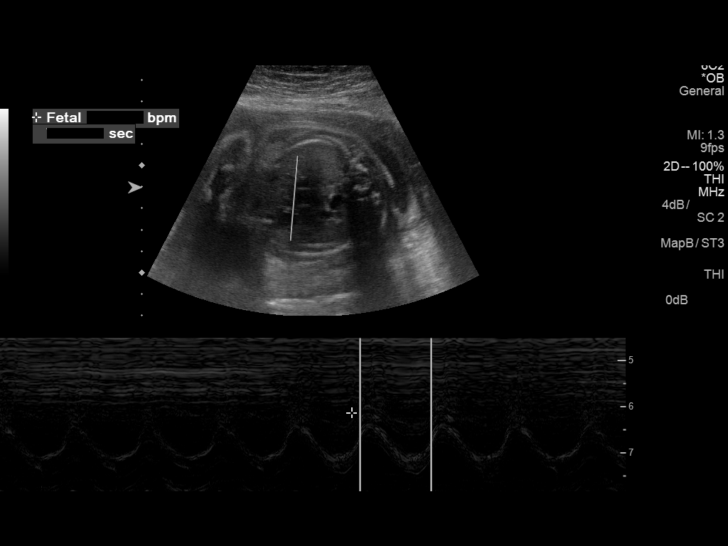
[im 49/89]
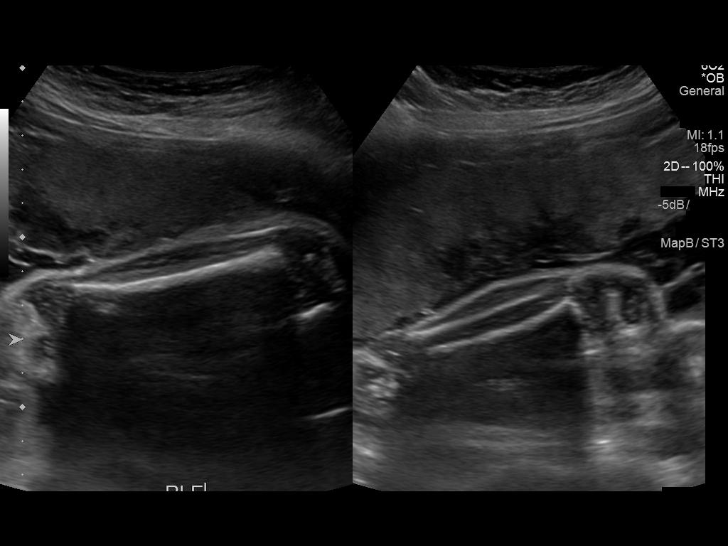
[im 56/89]
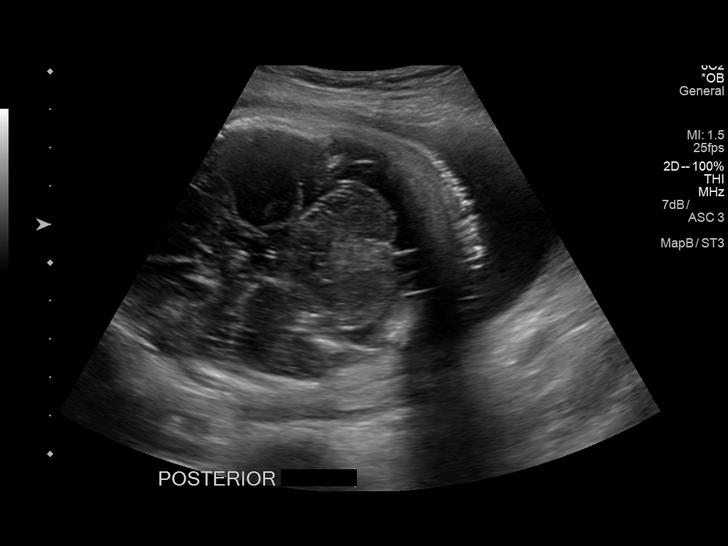
[im 62/89]
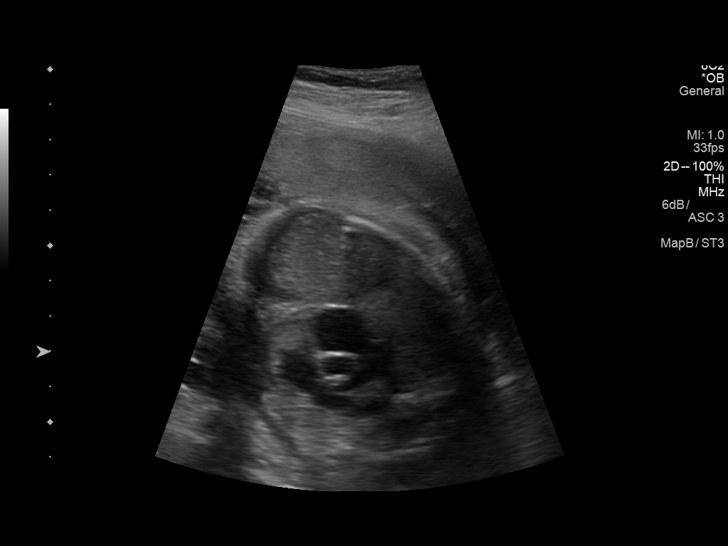
[im 69/89]
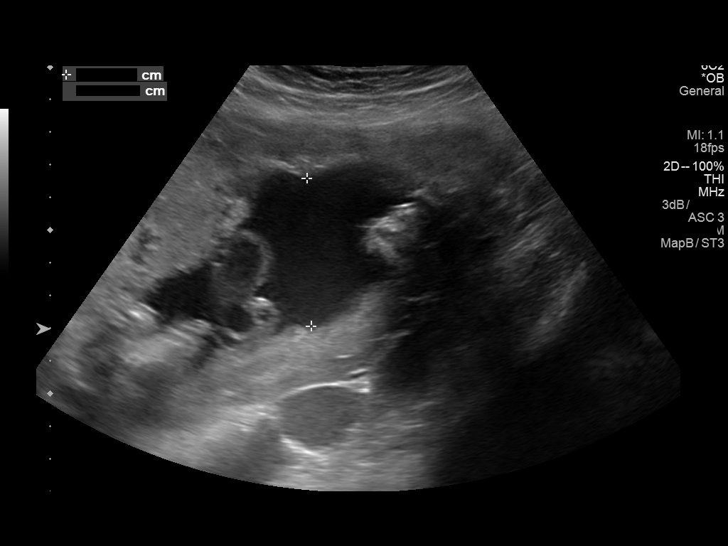
[im 75/89]
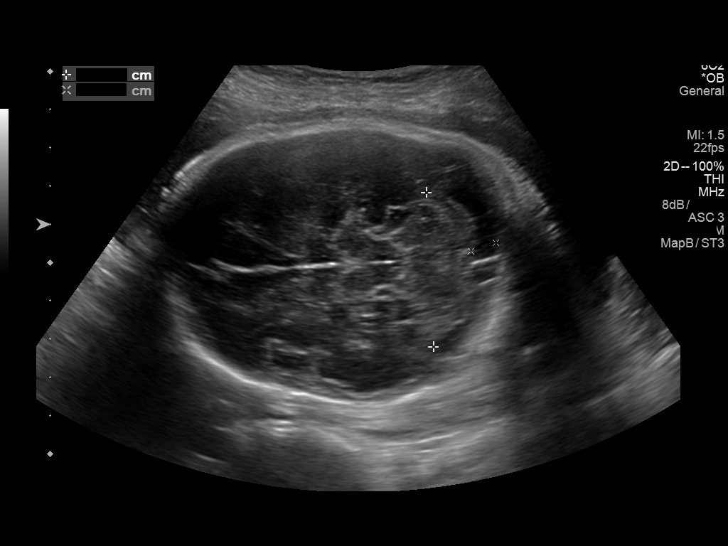
[im 82/89]
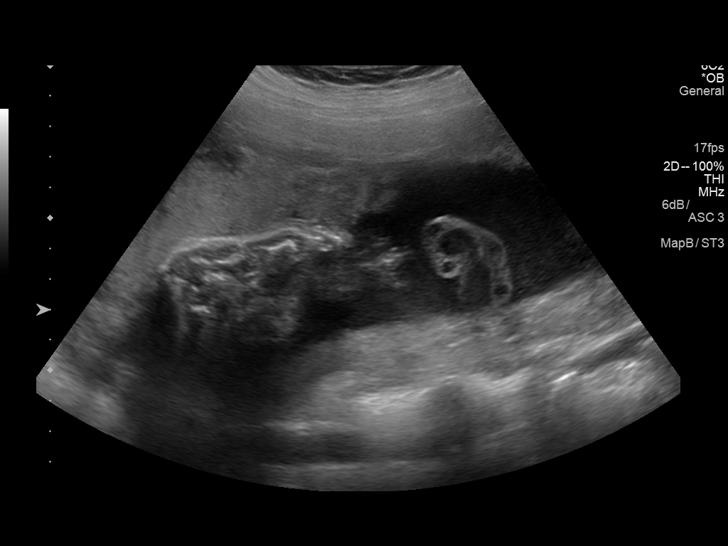
[im 89/89]
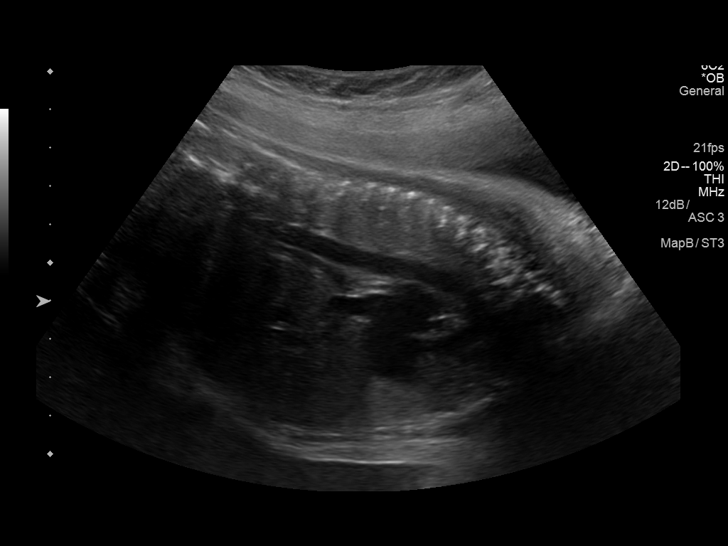

[14 of 28 positions shown; findings below may reference images not displayed]

FINDINGS: Number of Fetuses: 1

Heart Rate:  144 Bpm

Movement: Present

Presentation: Cephalic

Previa: None

Placental Location: Fundal

Amniotic Fluid (Subjective): Normal

Amniotic Fluid (Objective):

AFI 14.7 cm (5%ile= 9.0 cm, 95%= 23.4 cm for 30 wks)

FETAL BIOMETRY

BPD:  7.8cm 31w 1d

HC:    29.4cm  32w   3d

AC:   26.7cm  30w   6d

FL:   5.4cm  28w   5d

Current Mean GA: 30w 3d              US EDC: 09/26/2016

Estimated Fetal Weight:  1560g

FETAL ANATOMY

Lateral Ventricles: Visualized

Thalami/CSP: Visualized

Posterior Fossa:  Visualized

Nuchal Region: Not visualized

Upper Lip: Visualized

Spine: Visualized

4 Chamber Heart on Left: Visualized

LVOT: Visualized

RVOT: Visualized

Stomach on Left: Visualized

3 Vessel Cord: Visualized

Cord Insertion site: Visualized

Kidneys: Visualized

Bladder: Visualized

Extremities: Visualized

Technically difficult due to: Fetal position.

Maternal Findings:

Cervix:  4 cm and closed.
IMPRESSION: Single viable intrauterine pregnancy at 30 weeks 3 days.

## 2020-11-11 ENCOUNTER — Ambulatory Visit: Payer: BC Managed Care – PPO | Admitting: Family Medicine

## 2020-11-11 ENCOUNTER — Encounter: Payer: Self-pay | Admitting: Family Medicine

## 2020-11-11 ENCOUNTER — Other Ambulatory Visit: Payer: Self-pay

## 2020-11-11 VITALS — BP 95/59 | HR 84 | Temp 98.8°F | Resp 16 | Wt 150.6 lb

## 2020-11-11 DIAGNOSIS — T148XXA Other injury of unspecified body region, initial encounter: Secondary | ICD-10-CM

## 2020-11-11 DIAGNOSIS — E860 Dehydration: Secondary | ICD-10-CM | POA: Diagnosis not present

## 2020-11-11 NOTE — Progress Notes (Signed)
Established patient visit   Patient: Kaitlin Parks   DOB: 01/13/1996   25 y.o. Female  MRN: [id number] Visit Date: 11/11/2020  Today's healthcare provider: Dortha Kern, PA-C   Chief Complaint  Patient presents with   Pelvic Pain   Subjective    Pelvic Pain The patient's primary symptoms include pelvic pain. This is a new problem. The current episode started in the past 7 days. The problem has been waxing and waning. The pain is mild. The problem affects both sides. She is not pregnant. Associated symptoms include chills and constipation. Pertinent negatives include no abdominal pain, anorexia, back pain, diarrhea, discolored urine, dysuria, fever, flank pain, frequency, headaches, hematuria, joint pain, joint swelling, nausea, painful intercourse, rash, sore throat, urgency or vomiting. The symptoms are aggravated by activity (patient states that when she squats she experiences pain). She has tried NSAIDs for the symptoms. The treatment provided no relief. She is sexually active. No, her partner does not have an STD. She uses oral contraceptives and an IUD for contraception.     History reviewed. No pertinent past medical history. Past Surgical History:  Procedure Laterality Date   NO PAST SURGERIES     Social History   Tobacco Use   Smoking status: Never   Smokeless tobacco: Never  Vaping Use   Vaping Use: Never used  Substance Use Topics   Alcohol use: No   Drug use: No   Family Status  Relation Name Status   Mother  Alive   Father  Alive   MGM  Deceased   MGF  Deceased   PGM  Alive   PGF  Alive   No Known Allergies     Medications: Outpatient Medications Prior to Visit  Medication Sig   [DISCONTINUED] amoxicillin-clavulanate (AUGMENTIN) 875-125 MG tablet Take 1 tablet by mouth 2 (two) times daily.   No facility-administered medications prior to visit.    Review of Systems  Constitutional:  Positive for chills. Negative for fever.   HENT:  Negative for sore throat.   Gastrointestinal:  Positive for constipation. Negative for abdominal pain, anorexia, diarrhea, nausea and vomiting.  Genitourinary:  Positive for pelvic pain. Negative for dysuria, flank pain, frequency, hematuria and urgency.  Musculoskeletal:  Negative for back pain and joint pain.  Skin:  Negative for rash.  Neurological:  Negative for headaches.   Last CBC Lab Results  Component Value Date   WBC 5.3 08/09/2017   HGB 11.9 08/09/2017   HCT 35.0 08/09/2017   MCV 88 08/09/2017   MCH 29.8 08/09/2017   RDW 12.7 08/09/2017   PLT 273 09/22/2016   Last metabolic panel Lab Results  Component Value Date   GLUCOSE 91 08/09/2017   NA 140 08/09/2017   K 4.0 08/09/2017   CL 103 08/09/2017   CO2 21 08/09/2017   BUN 15 08/09/2017   CREATININE 0.63 08/09/2017   GFRNONAA 127 08/09/2017   GFRAA 146 08/09/2017   CALCIUM 9.3 08/09/2017   PROT 7.5 08/09/2017   ALBUMIN 4.5 08/09/2017   LABGLOB 3.0 08/09/2017   AGRATIO 1.5 08/09/2017   BILITOT 0.7 08/09/2017   ALKPHOS 86 08/09/2017   AST 11 08/09/2017   ALT 10 08/09/2017       Objective    BP (!) 95/59   Pulse 84   Temp 98.8 F (37.1 C) (Oral)   Resp 16   Wt 150 lb 9.6 oz (68.3 kg)   SpO2 99%   BMI 29.41 kg/m  LMP 11-06-20 with mild spotting ([place]).    Physical Exam Constitutional:      General: She is not in acute distress.    Appearance: She is well-developed.  HENT:     Head: Normocephalic and atraumatic.     Right Ear: Hearing and tympanic membrane normal.     Left Ear: Hearing normal.     Nose: Nose normal.  Eyes:     General: Lids are normal. No scleral icterus.       Right eye: No discharge.        Left eye: No discharge.     Conjunctiva/sclera: Conjunctivae normal.  Cardiovascular:     Rate and Rhythm: Normal rate and regular rhythm.     Pulses: Normal pulses.  Pulmonary:     Effort: Pulmonary effort is normal. No respiratory distress.  Abdominal:     General: Bowel  sounds are normal.     Palpations: Abdomen is soft.     Tenderness: There is no abdominal tenderness. There is no right CVA tenderness, left CVA tenderness, guarding or rebound.  Musculoskeletal:        General: Normal range of motion.     Cervical back: Normal range of motion and neck supple.  Skin:    Findings: No lesion or rash.  Neurological:     Mental Status: She is alert and oriented to person, place, and time.  Psychiatric:        Speech: Speech normal.        Behavior: Behavior normal.        Thought Content: Thought content normal.      No results found for any visits on 11/11/20.  Assessment & Plan     1. Muscle strain Developed some pelvic strain squatting while working in her house a week ago. Had some chills and throbbing in the top of her head when she stood up. No LOC but had a significant pain in the pelvis. Took some Aleve and symptoms abated. No recurrence today. Denies dysuria, vaginal discharge, hematuria, back pain or URI symptoms. No fever or congestion.  2. Mild dehydration Felt better after drinking more water. May have had a little dehydration during the hot weather and working on the floor. Increase fluid intake and recheck if any recurrence. No symptoms today   No follow-ups on file.      I, Ines Warf, PA-C, have reviewed all documentation for this visit. The documentation on 11/11/20 for the exam, diagnosis, procedures, and orders are all accurate and complete.    Dortha Kern, PA-C  [facility] [phone] (phone) [phone] (fax)  [facility]

## 2022-03-16 ENCOUNTER — Ambulatory Visit: Payer: Self-pay

## 2022-03-16 NOTE — Telephone Encounter (Signed)
  Chief Complaint: sore throat Symptoms: ear ache Frequency: 2 weeks Pertinent Negatives: Patient denies fever, trouble swallowing, rash or headache Disposition: [] ED /[] Urgent Care (no appt availability in office) / [x] Appointment(In office/virtual)/ []  [place] [facility]/ [] Home Care/ [] Refused Recommended Disposition /[] [facility]/ []  Follow-up with PCP Additional Notes: n/a Reason for Disposition  Earache also present  Answer Assessment - Initial Assessment Questions 1. ONSET: "When did the throat start hurting?" (Hours or days ago)      2 weeks ago but on and off 2. SEVERITY: "How bad is the sore throat?" (Scale 1-10; mild, moderate or severe)   - MILD (1-3):  Doesn't interfere with eating or normal activities.   - MODERATE (4-7): Interferes with eating some solids and normal activities.   - SEVERE (8-10):  Excruciating pain, interferes with most normal activities.   - SEVERE WITH DYSPHAGIA (10): Can't swallow liquids, drooling.     mild 3. STREP EXPOSURE: "Has there been any exposure to strep within the past week?" If Yes, ask: "What type of contact occurred?"      no 4.  VIRAL SYMPTOMS: "Are there any symptoms of a cold, such as a runny nose, cough, hoarse voice or red eyes?"      no 5. FEVER: "Do you have a fever?" If Yes, ask: "What is your temperature, how was it measured, and when did it start?"     no 6. PUS ON THE TONSILS: "Is there pus on the tonsils in the back of your throat?"     no 7. OTHER SYMPTOMS: "Do you have any other symptoms?" (e.g., difficulty breathing, headache, rash)     no 8. PREGNANCY: "Is there any chance you are pregnant?" "When was your last menstrual period?"     N/a  Protocols used: Sore Throat-A-AH

## 2022-03-17 ENCOUNTER — Ambulatory Visit: Payer: BC Managed Care – PPO | Admitting: Family Medicine

## 2022-03-17 ENCOUNTER — Encounter: Payer: Self-pay | Admitting: Family Medicine

## 2022-03-17 VITALS — BP 103/68 | HR 82 | Temp 98.1°F | Resp 16 | Ht 60.0 in | Wt 151.0 lb

## 2022-03-17 DIAGNOSIS — H66001 Acute suppurative otitis media without spontaneous rupture of ear drum, right ear: Secondary | ICD-10-CM | POA: Diagnosis not present

## 2022-03-17 DIAGNOSIS — B001 Herpesviral vesicular dermatitis: Secondary | ICD-10-CM | POA: Diagnosis not present

## 2022-03-17 DIAGNOSIS — J351 Hypertrophy of tonsils: Secondary | ICD-10-CM

## 2022-03-17 MED ORDER — VALACYCLOVIR HCL 1 G PO TABS: 1000.0000 mg | ORAL_TABLET | Freq: Two times a day (BID) | ORAL | 0 refills | Status: AC

## 2022-03-17 MED ORDER — AMOXICILLIN-POT CLAVULANATE 875-125 MG PO TABS: 1.0000 | ORAL_TABLET | Freq: Two times a day (BID) | ORAL | 0 refills | Status: DC

## 2022-03-17 MED ORDER — PREDNISONE 50 MG PO TABS: ORAL_TABLET | ORAL | 0 refills | Status: DC

## 2022-03-17 NOTE — Assessment & Plan Note (Signed)
3+ tonsillar enlargement; no exudate No recent travel or exposure to sick contacts

## 2022-03-17 NOTE — Assessment & Plan Note (Signed)
Acute, stable Recommend use of medication to assist Seek return visit if not improved

## 2022-03-17 NOTE — Progress Notes (Signed)
Established patient visit   Patient: Kaitlin Parks   DOB: 09-15-1995   26 y.o. Female  MRN: [id number] Visit Date: 03/17/2022  Today's healthcare provider: Jacky Kindle, FNP  Re Introduced to nurse practitioner role and practice setting.  All questions answered.  Discussed provider/patient relationship and expectations.  I,Tiffany J Bragg,acting as a scribe for Jacky Kindle, FNP.,have documented all relevant documentation on the behalf of Jacky Kindle, FNP,as directed by  Jacky Kindle, FNP while in the presence of Jacky Kindle, FNP.   Chief Complaint  Patient presents with   Ear Pain    Patient complains of bilateral ears and tonsils hurting off and on for 3 weeks.    Subjective    HPI HPI     Ear Pain    Additional comments: Patient complains of bilateral ears and tonsils hurting off and on for 3 weeks.       Last edited by Marlana Salvage, CMA on 03/17/2022  2:45 PM.       Medications: No outpatient medications prior to visit.   No facility-administered medications prior to visit.    Review of Systems    Objective    BP 103/68 (BP Location: Left Arm, Patient Position: Sitting, Cuff Size: Normal)   Pulse 82   Temp 98.1 F (36.7 C) (Oral)   Resp 16   Ht 5' (1.524 m)   Wt 151 lb (68.5 kg)   SpO2 100%   BMI 29.49 kg/m   Physical Exam Vitals and nursing note reviewed.  Constitutional:      General: She is not in acute distress.    Appearance: Normal appearance. She is overweight. She is not ill-appearing, toxic-appearing or diaphoretic.  HENT:     Head: Normocephalic and atraumatic.     Right Ear: Hearing, ear canal and external ear normal. Swelling and tenderness present. Tympanic membrane is erythematous.     Left Ear: Tympanic membrane, ear canal and external ear normal. There is no impacted cerumen.     Nose: Nose normal.     Mouth/Throat:     Lips: Lesions present.     Mouth: Mucous membranes are moist.     Pharynx:  Oropharynx is clear. Uvula midline. Posterior oropharyngeal erythema present. No oropharyngeal exudate.     Tonsils: No tonsillar exudate or tonsillar abscesses. 3+ on the right. 3+ on the left.   Eyes:     Extraocular Movements: Extraocular movements intact.     Conjunctiva/sclera: Conjunctivae normal.     Pupils: Pupils are equal, round, and reactive to light.  Cardiovascular:     Rate and Rhythm: Normal rate and regular rhythm.     Pulses: Normal pulses.     Heart sounds: Normal heart sounds. No murmur heard.    No friction rub. No gallop.  Pulmonary:     Effort: Pulmonary effort is normal. No respiratory distress.     Breath sounds: Normal breath sounds. No stridor. No wheezing, rhonchi or rales.  Chest:     Chest wall: No tenderness.  Musculoskeletal:        General: No swelling, tenderness, deformity or signs of injury. Normal range of motion.     Right lower leg: No edema.     Left lower leg: No edema.  Skin:    General: Skin is warm and dry.     Capillary Refill: Capillary refill takes less than 2 seconds.     Coloration: Skin is not  jaundiced or pale.     Findings: No bruising, erythema, lesion or rash.  Neurological:     General: No focal deficit present.     Mental Status: She is alert and oriented to person, place, and time. Mental status is at baseline.     Cranial Nerves: No cranial nerve deficit.     Sensory: No sensory deficit.     Motor: No weakness.     Coordination: Coordination normal.  Psychiatric:        Mood and Affect: Mood normal.        Behavior: Behavior normal.        Thought Content: Thought content normal.        Judgment: Judgment normal.     No results found for any visits on 03/17/22.  Assessment & Plan     Problem List Items Addressed This Visit       Digestive   Fever blister    Acute, stable Inner L bottom lip Use of vatrex to assist with course resolution; likely brought out d/t acute illness/stress       Relevant Medications    valACYclovir (VALTREX) 1000 MG tablet     Nervous and Auditory   Non-recurrent acute suppurative otitis media of right ear without spontaneous rupture of tympanic membrane - Primary    Acute, stable Recommend use of medication to assist Seek return visit if not improved       Relevant Medications   predniSONE (DELTASONE) 50 MG tablet   amoxicillin-clavulanate (AUGMENTIN) 875-125 MG tablet   valACYclovir (VALTREX) 1000 MG tablet     Other   Enlarged tonsils    3+ tonsillar enlargement; no exudate No recent travel or exposure to sick contacts        Relevant Medications   predniSONE (DELTASONE) 50 MG tablet   amoxicillin-clavulanate (AUGMENTIN) 875-125 MG tablet   Return if symptoms worsen or fail to improve.     Leilani Merl, FNP, have reviewed all documentation for this visit. The documentation on 03/17/22 for the exam, diagnosis, procedures, and orders are all accurate and complete.  Jacky Kindle, FNP  [facility] [phone] (phone) [phone] (fax)  [facility]

## 2022-03-17 NOTE — Assessment & Plan Note (Signed)
Acute, stable Inner L bottom lip Use of vatrex to assist with course resolution; likely brought out d/t acute illness/stress

## 2022-07-21 ENCOUNTER — Encounter: Payer: Self-pay | Admitting: Physician Assistant

## 2022-07-21 ENCOUNTER — Ambulatory Visit: Payer: 59 | Admitting: Physician Assistant

## 2022-07-21 VITALS — BP 107/71 | HR 78 | Ht 60.0 in | Wt 153.0 lb

## 2022-07-21 DIAGNOSIS — R1031 Right lower quadrant pain: Secondary | ICD-10-CM | POA: Diagnosis not present

## 2022-07-21 DIAGNOSIS — R202 Paresthesia of skin: Secondary | ICD-10-CM | POA: Diagnosis not present

## 2022-07-21 LAB — POCT URINALYSIS DIPSTICK
Bilirubin, UA: NEGATIVE
Blood, UA: NEGATIVE
Glucose, UA: NEGATIVE
Ketones, UA: NEGATIVE
Nitrite, UA: NEGATIVE
Protein, UA: NEGATIVE
Spec Grav, UA: 1.02 (ref 1.010–1.025)
Urobilinogen, UA: 0.2 E.U./dL
pH, UA: 6 (ref 5.0–8.0)

## 2022-07-21 NOTE — Patient Instructions (Addendum)
https://peterson.info/ https://www.shoulder-pain-explained.com/trapezius-stretches.html Ear To Shoulder -Slowly and with ease, take your right ear toward your right shoulder. It's natural for your left shoulder to lift as you do this. If that happens, ease your head back toward center until you can relax your left shoulder back down. Lift your right hand up and over your head, resting your hand on your left cheekbone. Do not pull on your head now, though. Simply rest your hand there for just slightly more pressure. This very gently stretches your upper trapezius. Breathe as you sit here for at least 30 seconds. Gently release this side, and then ease your left ear toward your left shoulder and complete the stretch on the other side, breathing deeply through it.  Shoulder Rolls Sit or stand in a comfortable position with good posture  Roll both shoulders back and down and then forwards and up in a circular motion  Start with small circles and gradually make the circles larger  Spend 30-60 seconds doing this and then repeat in the opposite direction

## 2022-07-21 NOTE — Progress Notes (Signed)
I,J'ya E Hunter,acting as a scribe for [employer], PA-C.,have documented all relevant documentation on the behalf of Mikey Kirschner, PA-C,as directed by  Mikey Kirschner, PA-C while in the presence of Mikey Kirschner, PA-C.   Established patient visit   Patient: Kaitlin Parks   DOB: 1996-01-31   27 y.o. Female  MRN: [id number] Visit Date: 07/21/2022  Today's healthcare provider: Mikey Kirschner, PA-C  Cc. Lower abdominal pain x 2 weeks  Subjective     Pt reports lower abdominal pain, cramping x 2 weeks reports it occurs intermittently, but most days. She reports for the past few days it has been harder to urinate, she feels she is not emptying. Denies dysuria, hematuria. Reports associated constipation, she has been taking miralax for the past few days which has helped some. Reports the pain has improved since it started.  She denies vaginal discharge, dyspareunia. Denies nausea, vomiting.   She has an IUD placed. Denies any regular bleeding. Denies STI risk.    She also reports intermittent shoulder/leg numbness/tingling when it is falling asleep. She will feel this when she puts her arms over the head--ie when propping her arm up to drive. Feeling resolves when she changes position.  Medications: Outpatient Medications Prior to Visit  Medication Sig   [DISCONTINUED] amoxicillin-clavulanate (AUGMENTIN) 875-125 MG tablet Take 1 tablet by mouth 2 (two) times daily. (Patient not taking: Reported on 07/21/2022)   [DISCONTINUED] predniSONE (DELTASONE) 50 MG tablet Take 1 tablet, PO, daily with morning meal. Take 5 days. (Patient not taking: Reported on 07/21/2022)   No facility-administered medications prior to visit.    Review of Systems  Gastrointestinal:  Positive for abdominal pain (discomfort in lower abdomen) and constipation. Negative for nausea.  Genitourinary:  Positive for pelvic pain. Negative for hematuria.  Neurological:  Negative for headaches (Maybe  unrelated: pt expressed discomfort when swallowing).     Objective    BP 107/71 (BP Location: Right Arm, Patient Position: Sitting, Cuff Size: Normal)   Pulse 78   Ht 5' (1.524 m)   Wt 153 lb (69.4 kg)   SpO2 100%   BMI 29.88 kg/m    Physical Exam Constitutional:      General: She is awake.     Appearance: She is well-developed.  HENT:     Head: Normocephalic.  Eyes:     Conjunctiva/sclera: Conjunctivae normal.  Cardiovascular:     Rate and Rhythm: Normal rate and regular rhythm.     Heart sounds: Normal heart sounds.  Pulmonary:     Effort: Pulmonary effort is normal.     Breath sounds: Normal breath sounds.  Abdominal:     General: Abdomen is flat.     Palpations: Abdomen is soft.     Tenderness: There is abdominal tenderness in the right lower quadrant. There is no guarding. Negative signs include Murphy's sign, Rovsing's sign and McBurney's sign.  Skin:    General: Skin is warm.  Neurological:     Mental Status: She is alert and oriented to person, place, and time.  Psychiatric:        Attention and Perception: Attention normal.        Mood and Affect: Mood normal.        Speech: Speech normal.        Behavior: Behavior is cooperative.      No results found for any visits on 07/21/22.  Assessment & Plan     RLQ abdominal pain UA today + leuks,  Culture ordered Advised continuing with constipation tx -- fluids, miralax, metamucil or other fiber supplement.  If culture negative and pain persists, would order pelvic ultrasound.   2. Paresthesias  Advised stretching, heating pads. D/t positional nerve compression  Return if symptoms worsen or fail to improve.     I, Mikey Kirschner, PA-C have reviewed all documentation for this visit. The documentation on  07/21/22  for the exam, diagnosis, procedures, and orders are all accurate and complete.  Mikey Kirschner, PA-C [facility] [address] [place], [place], [address] Office:  [phone] Fax: [place]

## 2022-07-23 LAB — URINE CULTURE

## 2022-07-23 LAB — SPECIMEN STATUS REPORT

## 2022-07-24 ENCOUNTER — Other Ambulatory Visit: Payer: Self-pay | Admitting: Family Medicine

## 2022-07-24 ENCOUNTER — Encounter: Payer: Self-pay | Admitting: Physician Assistant

## 2022-07-24 DIAGNOSIS — R102 Pelvic and perineal pain: Secondary | ICD-10-CM

## 2022-07-24 DIAGNOSIS — Z975 Presence of (intrauterine) contraceptive device: Secondary | ICD-10-CM

## 2022-07-24 DIAGNOSIS — R1031 Right lower quadrant pain: Secondary | ICD-10-CM

## 2022-07-27 ENCOUNTER — Ambulatory Visit
Admission: RE | Admit: 2022-07-27 | Discharge: 2022-07-27 | Disposition: A | Discharge status: Home / Self Care | Payer: 59 | Source: Ambulatory Visit | Attending: Family Medicine | Admitting: Family Medicine

## 2022-07-27 DIAGNOSIS — Z975 Presence of (intrauterine) contraceptive device: Secondary | ICD-10-CM | POA: Diagnosis present

## 2022-07-27 DIAGNOSIS — R102 Pelvic and perineal pain: Secondary | ICD-10-CM

## 2022-07-27 NOTE — Progress Notes (Signed)
Normal transvaginal and pelvic ultrasound. IUD visualized.

## 2022-07-31 ENCOUNTER — Other Ambulatory Visit: Payer: Self-pay | Admitting: Family Medicine

## 2022-07-31 DIAGNOSIS — K5909 Other constipation: Secondary | ICD-10-CM

## 2023-03-29 ENCOUNTER — Ambulatory Visit: Payer: Self-pay | Admitting: *Deleted

## 2023-03-29 ENCOUNTER — Other Ambulatory Visit: Payer: Self-pay | Admitting: Family Medicine

## 2023-03-29 MED ORDER — LACTULOSE 10 GM/15ML PO SOLN: 30.0000 g | Freq: Every day | ORAL | 0 refills | Status: DC | PRN

## 2023-03-29 NOTE — Telephone Encounter (Signed)
Summary: Symptoms, no appt today   Pt called reporting stomach cramps, no appt available. Constipation, spot that hurts on her pelvic area.  Best contact: [phone]         Interpreter: [id number] Attempted to call patient- no answer- left message to call office.

## 2023-03-29 NOTE — Telephone Encounter (Signed)
Chief Complaint: low abdominal cramping  Symptoms: low abdominal cramping , mild pain now , comes and goes. Noted in pelvic area right side mostly comes and goes. LBM today not loose not formed. Low abdomen tender to touch at times. Reports vaginal spotting at times. Hx IUD . Sx gradual onset Frequency: couple of weeks  Pertinent Negatives: Patient denies severe pain no fever no N/V reported, no urinary issues.  Disposition: [] ED /[] Urgent Care (no appt availability in office) / [] Appointment(In office/virtual)/ []  [place] [facility]/ [] Home Care/ [] Refused Recommended Disposition /[] [facility] [facility]/ [x]  Follow-up with PCP Additional Notes:   Attempted to schedule appt with PCP. No appt until Dec.4 with any provider. Patient requesting appt after 3 pm if possible. Please advise and patient would like a call back regarding appt if one becomes available before Dec. On wait list for appt. Offered today or tomorrow with E. Mecum, PA and patient unable to come in until after 3 pm.   Summary: Symptoms, no appt today   Pt called reporting stomach cramps, no appt available. Constipation, spot that hurts on her pelvic area.  Best contact: [phone]                 Reason for Disposition  Abdominal pain is a chronic symptom (recurrent or ongoing AND present > 4 weeks)  Answer Assessment - Initial Assessment Questions 1. LOCATION: "Where does it hurt?"      Low abdomen and right pelvic area  2. RADIATION: "Does the pain shoot anywhere else?" (e.g., chest, back)     Low back pain  3. ONSET: "When did the pain begin?" (e.g., minutes, hours or days ago)      Couple of weeks  4. SUDDEN: "Gradual or sudden onset?"     Gradual  5. PATTERN "Does the pain come and go, or is it constant?"    - If it comes and goes: "How long does it last?" "Do you have pain now?"     (Note: Comes and goes means the pain is intermittent. It goes away completely between bouts.)    - If constant:  "Is it getting better, staying the same, or getting worse?"      (Note: Constant means the pain never goes away completely; most serious pain is constant and gets worse.)      Comes and goes  6. SEVERITY: "How bad is the pain?"  (e.g., Scale 1-10; mild, moderate, or severe)    - MILD (1-3): Doesn't interfere with normal activities, abdomen soft and not tender to touch.     - MODERATE (4-7): Interferes with normal activities or awakens from sleep, abdomen tender to touch.     - SEVERE (8-10): Excruciating pain, doubled over, unable to do any normal activities.       Mild with touch and move a certain way  7. RECURRENT SYMPTOM: "Have you ever had this type of stomach pain before?" If Yes, ask: "When was the last time?" and "What happened that time?"      No  8. CAUSE: "What do you think is causing the stomach pain?"     na 9. RELIEVING/AGGRAVATING FACTORS: "What makes it better or worse?" (e.g., antacids, bending or twisting motion, bowel movement)     Aleeve and did help 10. OTHER SYMPTOMS: "Do you have any other symptoms?" (e.g., back pain, diarrhea, fever, urination pain, vomiting)       Low abdominal pain LBM today  11. PREGNANCY: "Is there any chance you  are pregnant?" "When was your last menstrual period?"       Na hx IUD. Spotting  Protocols used: Abdominal Pain - Female-A-AH

## 2023-04-13 ENCOUNTER — Other Ambulatory Visit: Payer: Self-pay

## 2023-04-13 ENCOUNTER — Emergency Department
Admission: EM | Admit: 2023-04-13 | Discharge: 2023-04-13 | Disposition: A | Discharge status: Home / Self Care | Payer: 59 | Attending: Emergency Medicine | Admitting: Emergency Medicine

## 2023-04-13 ENCOUNTER — Encounter: Payer: Self-pay | Admitting: Emergency Medicine

## 2023-04-13 DIAGNOSIS — R1013 Epigastric pain: Secondary | ICD-10-CM | POA: Diagnosis present

## 2023-04-13 LAB — URINALYSIS, ROUTINE W REFLEX MICROSCOPIC
Bilirubin Urine: NEGATIVE
Glucose, UA: NEGATIVE mg/dL
Hgb urine dipstick: NEGATIVE
Ketones, ur: NEGATIVE mg/dL
Nitrite: NEGATIVE
Protein, ur: NEGATIVE mg/dL
Specific Gravity, Urine: 1.032 — ABNORMAL HIGH (ref 1.005–1.030)
pH: 5 (ref 5.0–8.0)

## 2023-04-13 LAB — COMPREHENSIVE METABOLIC PANEL
ALT: 31 U/L (ref 0–44)
AST: 21 U/L (ref 15–41)
Albumin: 4.3 g/dL (ref 3.5–5.0)
Alkaline Phosphatase: 62 U/L (ref 38–126)
Anion gap: 9 (ref 5–15)
BUN: 20 mg/dL (ref 6–20)
CO2: 23 mmol/L (ref 22–32)
Calcium: 9.1 mg/dL (ref 8.9–10.3)
Chloride: 105 mmol/L (ref 98–111)
Creatinine, Ser: 0.75 mg/dL (ref 0.44–1.00)
GFR, Estimated: >60 mL/min (ref >60)
Glucose, Bld: 104 mg/dL — ABNORMAL HIGH (ref 70–99)
Potassium: 3.6 mmol/L (ref 3.5–5.1)
Sodium: 137 mmol/L (ref 135–145)
Total Bilirubin: 1 mg/dL (ref <1.2)
Total Protein: 7.9 g/dL (ref 6.5–8.1)

## 2023-04-13 LAB — CBC WITH DIFFERENTIAL/PLATELET
Abs Immature Granulocytes: 0.01 10*3/uL (ref 0.00–0.07)
Basophils Absolute: 0 10*3/uL (ref 0.0–0.1)
Basophils Relative: 0 %
Eosinophils Absolute: 0.1 10*3/uL (ref 0.0–0.5)
Eosinophils Relative: 1 %
HCT: 37.2 % (ref 36.0–46.0)
Hemoglobin: 12.4 g/dL (ref 12.0–15.0)
Immature Granulocytes: 0 %
Lymphocytes Relative: 30 %
Lymphs Abs: 1.8 10*3/uL (ref 0.7–4.0)
MCH: 29.5 pg (ref 26.0–34.0)
MCHC: 33.3 g/dL (ref 30.0–36.0)
MCV: 88.6 fL (ref 80.0–100.0)
Monocytes Absolute: 0.5 10*3/uL (ref 0.1–1.0)
Monocytes Relative: 9 %
Neutro Abs: 3.5 10*3/uL (ref 1.7–7.7)
Neutrophils Relative %: 60 %
Platelets: 395 10*3/uL (ref 150–400)
RBC: 4.2 MIL/uL (ref 3.87–5.11)
RDW: 12.3 % (ref 11.5–15.5)
WBC: 5.9 10*3/uL (ref 4.0–10.5)
nRBC: 0 % (ref 0.0–0.2)

## 2023-04-13 LAB — PREGNANCY, URINE: Preg Test, Ur: NEGATIVE

## 2023-04-13 LAB — LIPASE, BLOOD: Lipase: 39 U/L (ref 11–51)

## 2023-04-13 MED ORDER — FAMOTIDINE 20 MG PO TABS: 20.0000 mg | ORAL_TABLET | Freq: Every day | ORAL | 2 refills | Status: DC

## 2023-04-13 NOTE — ED Provider Triage Note (Signed)
Emergency Medicine Provider Triage Evaluation Note  Kaitlin Parks , a 27 y.o. female  was evaluated in triage.  Pt complains of abdominal pain that is central for the past couple of weeks, had low BP at [facility] and was sent over here. Reports has IUD. No N/V/D  Review of Systems  Positive: Abdominal pain Negative: dizziness  Physical Exam  Ht 5' (1.524 m)   Wt 69 kg   BMI 29.71 kg/m  Gen:   Awake, no distress   Resp:  Normal effort  MSK:   Moves extremities without difficulty  Other:    Medical Decision Making  Medically screening exam initiated at 8:44 AM.  Appropriate orders placed.  Suzan Nailer was informed that the remainder of the evaluation will be completed by another provider, this initial triage assessment does not replace that evaluation, and the importance of remaining in the ED until their evaluation is complete.     Jackelyn Hoehn, PA-C 04/13/23 [phone]

## 2023-04-13 NOTE — Discharge Instructions (Signed)
You are seen in the emergency department for abdominal pain.  You were sent from urgent care for low blood pressure.-year-old multiple blood pressures that were normal in the emergency department.  Your lab work was normal.  On bedside ultrasound did not show an obvious gallstone.  You had no concerns for gallbladder infection.  Your urine was sent for culture.  You will be notified if it is positive for an infection and they will call you in antibiotics.  Return to the emergency department if any worsening symptoms.  You were started on Pepcid.  Take this on a daily basis.  Only take the lactulose that your primary care providers prescribed if you are having severe constipation.  You can take Tylenol for pain control.  Thank you for choosing [place] for your health care, it was my pleasure to care for you today!  Corena Herter, MD

## 2023-04-13 NOTE — ED Provider Notes (Signed)
[facility] Provider Note    Event Date/Time   First MD Initiated Contact with Patient 04/13/23 [phone]     (approximate)   History   Abdominal Pain   HPI  Kaitlin Parks is a 27 y.o. female presents to the emergency department with abdominal pain.  Patient states that she has been having intermittent episodes of abdominal pain over the past couple of weeks.  States that it comes and goes.  It is usually to her lower abdomen.  Intermittently has upper abdominal pain.  Mild worsening after eating.  Denies any episodes of vomiting or diarrhea.  Went to urgent care today and was told to come into the emergency department for low blood pressure.  States that they checked her blood pressure and it was low and they repeated it and it had significantly improved.  She stated that she was told that was still too low and she needed to come to the emergency department.  Denies any concern for pregnancy.  Denies any concern for an STD.  States that she is has been constipated.  Called her primary care physician on the culture and lactulose.  Now having normal bowel movements but continues to have ongoing intermittent pain.  Denies any alcohol or drug use.     Physical Exam   Triage Vital Signs: ED Triage Vitals  Encounter Vitals Group     BP 04/13/23 0845 122/83     Systolic BP Percentile --      Diastolic BP Percentile --      Pulse Rate 04/13/23 0845 91     Resp 04/13/23 0845 18     Temp 04/13/23 0845 97.9 F (36.6 C)     Temp Source 04/13/23 0845 Oral     SpO2 04/13/23 0845 100 %     Weight 04/13/23 0844 152 lb 1.9 oz (69 kg)     Height 04/13/23 0844 5' (1.524 m)     Head Circumference --      Peak Flow --      Pain Score 04/13/23 0844 2     Pain Loc --      Pain Education --      Exclude from Growth Chart --     Most recent vital signs: Vitals:   04/13/23 0845  BP: 122/83  Pulse: 91  Resp: 18  Temp: 97.9 F (36.6 C)  SpO2: 100%     Physical Exam Constitutional:      Appearance: She is well-developed.  HENT:     Head: Atraumatic.  Eyes:     Conjunctiva/sclera: Conjunctivae normal.  Cardiovascular:     Rate and Rhythm: Regular rhythm.  Pulmonary:     Effort: No respiratory distress.  Abdominal:     General: There is no distension.     Tenderness: There is no abdominal tenderness.  Musculoskeletal:        General: Normal range of motion.     Cervical back: Normal range of motion.  Skin:    General: Skin is warm.  Neurological:     Mental Status: She is alert. Mental status is at baseline.      IMPRESSION / MDM / ASSESSMENT AND PLAN / ED COURSE  I reviewed the triage vital signs and the nursing notes.  Differential diagnosis including IBS, gastritis/PUD, constipation, ovarian cyst.  Abdomen is nontender to palpation.  Have a low suspicion for acute cholecystitis, acute appendicitis or bowel obstruction.   Labs (all labs ordered are listed,  but only abnormal results are displayed) Labs interpreted as -    Labs Reviewed  URINALYSIS, ROUTINE W REFLEX MICROSCOPIC - Abnormal; Notable for the following components:      Result Value   Color, Urine YELLOW (*)    APPearance HAZY (*)    Specific Gravity, Urine 1.032 (*)    Leukocytes,Ua MODERATE (*)    Bacteria, UA RARE (*)    All other components within normal limits  COMPREHENSIVE METABOLIC PANEL - Abnormal; Notable for the following components:   Glucose, Bld 104 (*)    All other components within normal limits  RESP PANEL BY RT-PCR (RSV, FLU A&B, COVID)  RVPGX2  URINE CULTURE  CBC WITH DIFFERENTIAL/PLATELET  LIPASE, BLOOD  PREGNANCY, URINE    Patient without symptoms of urinary tract infection.  UA appears to not have a clean-catch, moderate leukocyte esterase but rare bacteria.  Urine was sent for culture.  Will not treat at this time.  Lab work overall reassuring.  No signs of electrolyte abnormality.  No leukocytosis.  Pregnancy test is  negative.  Blood pressure on multiple repeats in the emergency department within normal limits.  Concern for possible IBS.  Given her mild upper abdominal symptoms we will start the patient on a PPI.  Bedside gallbladder ultrasound without findings of acute cholecystitis and no obvious gallstones present.  Discussed close follow-up with primary care physician and possible referral for gastroenterology.  Discussed return precautions to the emergency department for any return or worsening pain and that she may need imaging at that time.  Discussed Motrin and Tylenol for pain control.     PROCEDURES:  Critical Care performed: No  Procedures  Patient's presentation is most consistent with acute complicated illness / injury requiring diagnostic workup.   MEDICATIONS ORDERED IN ED: Medications - No data to display  FINAL CLINICAL IMPRESSION(S) / ED DIAGNOSES   Final diagnoses:  Epigastric pain     Rx / DC Orders   ED Discharge Orders          Ordered    famotidine (PEPCID) 20 MG tablet  Daily        04/13/23 0956             Note:  This document was prepared using Dragon voice recognition software and may include unintentional dictation errors.   Corena Herter, MD 04/13/23 1447

## 2023-04-13 NOTE — ED Triage Notes (Signed)
Patient to ED via POV for abd pain. Sent from [facility] due to low BP. C/o generalized abd aching. Intermittent for a couple of weeks. Denies N/V/D.

## 2023-04-15 LAB — URINE CULTURE: Culture: <10000 — AB

## 2023-06-22 ENCOUNTER — Other Ambulatory Visit (HOSPITAL_COMMUNITY)
Admission: RE | Admit: 2023-06-22 | Discharge: 2023-06-22 | Disposition: A | Discharge status: Home / Self Care | Payer: 59 | Source: Ambulatory Visit | Attending: Physician Assistant | Admitting: Physician Assistant

## 2023-06-22 ENCOUNTER — Ambulatory Visit (INDEPENDENT_AMBULATORY_CARE_PROVIDER_SITE_OTHER): Payer: 59 | Admitting: Physician Assistant

## 2023-06-22 ENCOUNTER — Encounter: Payer: Self-pay | Admitting: Physician Assistant

## 2023-06-22 VITALS — BP 105/71 | HR 80 | Ht 60.0 in | Wt 148.0 lb

## 2023-06-22 DIAGNOSIS — Z7689 Persons encountering health services in other specified circumstances: Secondary | ICD-10-CM

## 2023-06-22 DIAGNOSIS — R399 Unspecified symptoms and signs involving the genitourinary system: Secondary | ICD-10-CM | POA: Diagnosis not present

## 2023-06-22 DIAGNOSIS — R1031 Right lower quadrant pain: Secondary | ICD-10-CM

## 2023-06-22 DIAGNOSIS — R102 Pelvic and perineal pain: Secondary | ICD-10-CM | POA: Insufficient documentation

## 2023-06-22 DIAGNOSIS — K5909 Other constipation: Secondary | ICD-10-CM

## 2023-06-22 LAB — POCT URINALYSIS DIPSTICK
Bilirubin, UA: NEGATIVE
Blood, UA: NEGATIVE
Glucose, UA: NEGATIVE
Ketones, UA: NEGATIVE
Nitrite, UA: NEGATIVE
Protein, UA: NEGATIVE
Spec Grav, UA: 1.015 (ref 1.010–1.025)
Urobilinogen, UA: 0.2 U/dL
pH, UA: 6.5 (ref 5.0–8.0)

## 2023-06-22 LAB — POCT URINE PREGNANCY: Preg Test, Ur: NEGATIVE

## 2023-06-22 NOTE — Progress Notes (Signed)
 Established patient visit  Patient: Kaitlin Parks   DOB: [birth year]   28 y.o. Female  MRN: [id number] Visit Date: 06/22/2023  Today's healthcare provider: Debera Lat, PA-C   Chief Complaint  Patient presents with   Abdominal Pain    On and off for a couple months   Subjective    Kaitlin Parks is a 28 y.o. female who presents today as a new patient to establish care.   Discussed the use of AI scribe software for clinical note transcription with the patient, who gave verbal consent to proceed.  History of Present Illness   The patient, with a past medical history of intermittent right lower quadrant abdominal pain, presents with a similar complaint. The pain is described as an ache, localized to one spot in the right lower quadrant. It is not constant and is triggered by certain movements. The patient denies any radiation of the pain. The pain does not seem to be associated with any specific activities or food intake. The patient also reports occasional constipation and a feeling of incomplete bowel movements. There is no history of blood in stool or any other alarming gastrointestinal symptoms. The patient also mentions a small bump on the lip that has been present for a year. The bump is not painful and has decreased in size over the year.      No past medical history on file. Past Surgical History:  Procedure Laterality Date   NO PAST SURGERIES     Family Status  Relation Name Status   Mother  Alive   Father  Alive   MGM  Deceased   MGF  Deceased   PGM  Alive   PGF  Alive  No partnership data on file   Family History  Problem Relation Age of Onset   Healthy Mother    Healthy Father    Social History   Socioeconomic History   Marital status: Single    Spouse name: Not on file   Number of children: Not on file   Years of education: Not on file   Highest education level: Not on file  Occupational History   Not on file  Tobacco Use    Smoking status: Never   Smokeless tobacco: Never  Vaping Use   Vaping status: Never Used  Substance and Sexual Activity   Alcohol use: No   Drug use: No   Sexual activity: Not on file  Other Topics Concern   Not on file  Social History Narrative   Not on file   Social Drivers of Health   Financial Resource Strain: Not on file  Food Insecurity: Not on file  Transportation Needs: Not on file  Physical Activity: Not on file  Stress: Not on file  Social Connections: Not on file   Outpatient Medications Prior to Visit  Medication Sig   levonorgestrel (KYLEENA) 19.5 MG IUD 1 each by Intrauterine route once.   [DISCONTINUED] famotidine (PEPCID) 20 MG tablet Take 1 tablet (20 mg total) by mouth daily.   [DISCONTINUED] lactulose (CHRONULAC) 10 GM/15ML solution Take 45 mLs (30 g total) by mouth daily as needed for mild constipation.   No facility-administered medications prior to visit.   No Known Allergies  Immunization History  Administered Date(s) Administered   MMR 11/01/2015, 09/22/2016    Health Maintenance  Topic Date Due   HIV Screening  Never done   Hepatitis C Screening  Never done   DTaP/Tdap/Td (1 - Tdap) Never done   Cervical  Cancer Screening (Pap smear)  Never done   INFLUENZA VACCINE  Never done   COVID-19 Vaccine (1 - 2024-25 season) Never done   HPV VACCINES  Aged Out    Patient Care Team: Debera Lat, PA-C as PCP - General (Physician Assistant)  Review of Systems  All other systems reviewed and are negative.  Except see HPI       Objective    BP 105/71   Pulse 80   Ht 5' (1.524 m)   Wt 148 lb (67.1 kg)   BMI 28.90 kg/m     Physical Exam Vitals reviewed.  Constitutional:      General: She is not in acute distress.    Appearance: Normal appearance. She is well-developed. She is not diaphoretic.  HENT:     Head: Normocephalic and atraumatic.  Eyes:     General: No scleral icterus.    Conjunctiva/sclera: Conjunctivae normal.   Neck:     Thyroid: No thyromegaly.  Cardiovascular:     Rate and Rhythm: Normal rate and regular rhythm.     Pulses: Normal pulses.     Heart sounds: Normal heart sounds. No murmur heard. Pulmonary:     Effort: Pulmonary effort is normal. No respiratory distress.     Breath sounds: Normal breath sounds. No wheezing, rhonchi or rales.  Abdominal:     General: Bowel sounds are normal. There is no distension.     Tenderness: There is abdominal tenderness (lower abdomen). There is no guarding or rebound.  Musculoskeletal:     Cervical back: Neck supple.     Right lower leg: No edema.     Left lower leg: No edema.  Lymphadenopathy:     Cervical: No cervical adenopathy.  Skin:    General: Skin is warm and dry.     Findings: No rash.  Neurological:     Mental Status: She is alert and oriented to person, place, and time. Mental status is at baseline.  Psychiatric:        Mood and Affect: Mood normal.        Behavior: Behavior normal.     Depression Screen    06/22/2023    9:02 AM 03/17/2022    2:46 PM 11/11/2020    2:57 PM  PHQ 2/9 Scores  PHQ - 2 Score 0 0 0  PHQ- 9 Score 1 0 1   Results for orders placed or performed in visit on 06/22/23  POCT urinalysis dipstick  Result Value Ref Range   Color, UA     Clarity, UA     Glucose, UA Negative Negative   Bilirubin, UA neg    Ketones, UA neg    Spec Grav, UA 1.015 1.010 - 1.025   Blood, UA neg    pH, UA 6.5 5.0 - 8.0   Protein, UA Negative Negative   Urobilinogen, UA 0.2 0.2 or 1.0 E.U./dL   Nitrite, UA neg    Leukocytes, UA Small (1+) (A) Negative   Appearance     Odor    POCT urine pregnancy  Result Value Ref Range   Preg Test, Ur Negative Negative    Assessment & Plan         Right Lower Quadrant Pain/pelvic pain Intermittent, localized pain in the right lower quadrant. No associated symptoms suggestive of appendicitis or kidney stones. Previous imaging (ultrasound) done last year was inconclusive. -Order  repeat abdominal ultrasound to evaluate for possible ovarian cyst or other gynecological issues. -Advise patient to follow  up with her OBGYN for a pelvic exam. In the past, there was RLQ pain in 2023, was investigated by ObGYn.  Urinary symptoms/Possible Urinary Tract Infection Dipstick urinalysis showed possible infection. -Send urine sample for microscopy and culture. -Advise patient to increase water intake.  Constipation Reports incomplete bowel movements and irregular bowel habits. -Advise dietary adjustments, including increased fiber intake and probiotics.  Oral Lesion Chronic, X1 year, non-painful lesion on the lip, possibly irritated by certain foods or habits. -Advise patient to monitor for changes and irritation. -Recommend evaluation by a dentist or oral surgeon if the lesion persists or changes.  General Health Maintenance -Advise patient to consider STD testing, given the option of self-swabbing. -Advise patient to discuss the oral lesion with her dentist during her annual exam.    No follow-ups on file.    The patient was advised to call back or seek an in-person evaluation if the symptoms worsen or if the condition fails to improve as anticipated.  I discussed the assessment and treatment plan with the patient. The patient was provided an opportunity to ask questions and all were answered. The patient agreed with the plan and demonstrated an understanding of the instructions.  I, Debera Lat, PA-C have reviewed all documentation for this visit. The documentation on  06/22/2023   for the exam, diagnosis, procedures, and orders are all accurate and complete.  Debera Lat, [facility] MMS [facility] [phone] (phone) [phone] (fax)  [facility]

## 2023-06-23 DIAGNOSIS — R1031 Right lower quadrant pain: Secondary | ICD-10-CM | POA: Insufficient documentation

## 2023-06-23 DIAGNOSIS — K5909 Other constipation: Secondary | ICD-10-CM | POA: Insufficient documentation

## 2023-06-25 ENCOUNTER — Encounter: Payer: Self-pay | Admitting: Physician Assistant

## 2023-06-25 LAB — CERVICOVAGINAL ANCILLARY ONLY
Bacterial Vaginitis (gardnerella): NEGATIVE
Candida Glabrata: NEGATIVE
Candida Vaginitis: NEGATIVE
Chlamydia: NEGATIVE
Comment: NEGATIVE
Comment: NEGATIVE
Comment: NEGATIVE
Comment: NEGATIVE
Comment: NEGATIVE
Comment: NORMAL
Neisseria Gonorrhea: NEGATIVE
Trichomonas: NEGATIVE

## 2023-07-06 ENCOUNTER — Ambulatory Visit
Admission: RE | Admit: 2023-07-06 | Discharge: 2023-07-06 | Disposition: A | Discharge status: Home / Self Care | Payer: 59 | Source: Ambulatory Visit | Attending: Physician Assistant | Admitting: Physician Assistant

## 2023-07-06 DIAGNOSIS — R1031 Right lower quadrant pain: Secondary | ICD-10-CM | POA: Insufficient documentation

## 2023-07-06 DIAGNOSIS — R102 Pelvic and perineal pain: Secondary | ICD-10-CM | POA: Insufficient documentation

## 2023-07-24 ENCOUNTER — Encounter: Payer: Self-pay | Admitting: Physician Assistant

## 2023-08-06 ENCOUNTER — Ambulatory Visit (INDEPENDENT_AMBULATORY_CARE_PROVIDER_SITE_OTHER): Admitting: Nurse Practitioner

## 2023-08-06 ENCOUNTER — Encounter: Payer: Self-pay | Admitting: Nurse Practitioner

## 2023-08-06 VITALS — BP 116/64 | HR 68 | Temp 97.8°F | Resp 18 | Ht 60.0 in | Wt 147.9 lb

## 2023-08-06 DIAGNOSIS — R21 Rash and other nonspecific skin eruption: Secondary | ICD-10-CM

## 2023-08-06 MED ORDER — TRIAMCINOLONE ACETONIDE 0.1 % EX CREA: 1.0000 | TOPICAL_CREAM | Freq: Two times a day (BID) | CUTANEOUS | 0 refills | Status: AC

## 2023-08-06 NOTE — Progress Notes (Signed)
 BP 116/64   Pulse 68   Temp 97.8 F (36.6 C)   Resp 18   Ht 5' (1.524 m)   Wt 147 lb 14.4 oz (67.1 kg)   SpO2 97%   BMI 28.88 kg/m    Subjective:    Patient ID: Kaitlin Parks, female    DOB: [birth year], 28 y.o.   MRN: [id number]  HPI: Kaitlin Parks is a 28 y.o. female Patient presents with complaints of small red, flat, itchy, solitary bumps on left leg, buttocks, back, and stomach. Patient denies fever, fatigue, and changes in weight. Patient also denies use of new soaps, dertergents, lotions, changes in diet or medications. Patient reports that bumps are tender but not painful.      08/06/2023    8:42 AM 06/22/2023    9:02 AM 03/17/2022    2:46 PM  Depression screen PHQ 2/9  Decreased Interest 0 0 0  Down, Depressed, Hopeless 0 0 0  PHQ - 2 Score 0 0 0  Altered sleeping 0 0 0  Tired, decreased energy 0 1 0  Change in appetite 0 0 0  Feeling bad or failure about yourself  0 0 0  Trouble concentrating 0 0 0  Moving slowly or fidgety/restless 0 0 0  Suicidal thoughts 0 0 0  PHQ-9 Score 0 1 0  Difficult doing work/chores Not difficult at all Not difficult at all Not difficult at all    Relevant past medical, surgical, family and social history reviewed and updated as indicated. Interim medical history since our last visit reviewed. Allergies and medications reviewed and updated.  Review of Systems Ten systems reviewed and is negative except as mentioned in HPI      Objective:    BP 116/64   Pulse 68   Temp 97.8 F (36.6 C)   Resp 18   Ht 5' (1.524 m)   Wt 147 lb 14.4 oz (67.1 kg)   SpO2 97%   BMI 28.88 kg/m    Wt Readings from Last 3 Encounters:  08/06/23 147 lb 14.4 oz (67.1 kg)  06/22/23 148 lb (67.1 kg)  04/13/23 152 lb 1.9 oz (69 kg)    Physical Exam Vitals reviewed.  Constitutional:      Appearance: Normal appearance.  HENT:     Head: Normocephalic.  Cardiovascular:     Rate and Rhythm: Normal rate and regular rhythm.   Pulmonary:     Effort: Pulmonary effort is normal.     Breath sounds: Normal breath sounds.  Musculoskeletal:        General: Normal range of motion.  Skin:    General: Skin is warm and dry.     Comments: Small bumps noted in various areas on body in different variations of healing  Neurological:     General: No focal deficit present.     Mental Status: She is alert and oriented to person, place, and time. Mental status is at baseline.  Psychiatric:        Mood and Affect: Mood normal.        Behavior: Behavior normal.        Thought Content: Thought content normal.        Judgment: Judgment normal.          Assessment & Plan:   Problem List Items Addressed This Visit   None Visit Diagnoses       Rash    -  Primary   rash is a few small bumps  located in various areas.  discussed possible bed bugs, or other insect bite.  may use steroid cream for itchiness   Relevant Medications   triamcinolone cream (KENALOG) 0.1 %         Follow up plan: Return if symptoms worsen or fail to improve.

## 2023-08-21 ENCOUNTER — Telehealth: Payer: Self-pay

## 2023-08-21 DIAGNOSIS — K5909 Other constipation: Secondary | ICD-10-CM

## 2023-08-21 NOTE — Telephone Encounter (Signed)
 Copied from CRM [id number]. Topic: Referral - Request for Referral >> Aug 21, 2023 12:09 PM Phil Braun wrote: Did the patient discuss referral with their provider in the last year? Yes   Appointment offered? Yes  Type of order/referral and detailed reason for visit: Gastrointestinal  Preference of office, provider, location: [place] area  If referral order, have you been seen by this specialty before? No (If Yes, this issue or another issue? When? Where?  Can we respond through MyChart? Yes

## 2023-08-22 NOTE — Telephone Encounter (Signed)
 New Gi referral placed. [facility] is not taking any patient at this time per referral coordinator.  Referral going to [facility]. Their number is [phone]

## 2023-09-05 ENCOUNTER — Telehealth: Payer: Self-pay

## 2023-09-05 NOTE — Telephone Encounter (Signed)
 Copied from CRM [id number]. Topic: General - Other >> Sep 05, 2023  2:30 PM Marissa P wrote: Reason for CRM: Patient would like an recommendation for another gastrologist please and a callback [phone]

## 2023-09-12 NOTE — Telephone Encounter (Signed)
 Copied from CRM [id number]. Topic: Referral - Question >> Sep 12, 2023  2:13 PM Turkey B wrote: Reason for CRM: pt called in trying to get a sooner apt elsewhere for GI specialist, the one she had for [facility] , appt isnt available until August. Please cb for further assistance

## 2023-10-22 ENCOUNTER — Ambulatory Visit: Admitting: Physician Assistant

## 2023-10-26 ENCOUNTER — Ambulatory Visit: Admitting: Physician Assistant

## 2023-10-29 ENCOUNTER — Ambulatory Visit (INDEPENDENT_AMBULATORY_CARE_PROVIDER_SITE_OTHER): Admitting: Family Medicine

## 2023-10-29 ENCOUNTER — Encounter: Payer: Self-pay | Admitting: Family Medicine

## 2023-10-29 VITALS — BP 106/72 | HR 109 | Ht 60.0 in | Wt 152.0 lb

## 2023-10-29 DIAGNOSIS — R202 Paresthesia of skin: Secondary | ICD-10-CM | POA: Insufficient documentation

## 2023-10-29 DIAGNOSIS — R1031 Right lower quadrant pain: Secondary | ICD-10-CM

## 2023-10-29 DIAGNOSIS — K921 Melena: Secondary | ICD-10-CM

## 2023-10-29 DIAGNOSIS — R739 Hyperglycemia, unspecified: Secondary | ICD-10-CM | POA: Diagnosis not present

## 2023-10-29 NOTE — Assessment & Plan Note (Signed)
 Patient is having bilateral foot burning/tingling of uncertain etiology. She denies any dietary changes, neurologic back pain, vascular symptoms, or history of diabetes. She has a reassuring physical exam with normal reflexes. She denies having issues controlling bowel or bladder function. We discussed that vitamin deficiencies and anemia can cause paresthesias at times and we can check those to assess for any abnormalities. -B12, B9, Vitamin E, CMET ordered -will follow up with PCP for physical -Will call patient if any abnormal results arise

## 2023-10-29 NOTE — Assessment & Plan Note (Signed)
 Patient is having intermittent RLQ pain and is also having blood in some of her stools. Denies fevers or unintentional weight loss. Is not feeling short of breath. She has been referred to GI and has an appointment for August. We are going to check her CBC today to see if she has become anemic at all to see if she needs to be seen more expediently by GI. -CBC ordered -Referral sent to [facility] for GI to see if they have any sooner appointments

## 2023-10-29 NOTE — Progress Notes (Signed)
 Acute Office Visit  Subjective:     Patient ID: Kaitlin Parks, female    DOB: 1995/08/04, 28 y.o.   MRN: [id number]  Chief Complaint  Patient presents with   Foot Burn    Burning and tingling sensation on the bottom of her feet more so when she is sitting for long periods of time  this has been going on for about 2 weeks    Kaitlin Parks is a 28 y/o female in today for concerns of bilateral foot burning sensation that began suddenly at work a few weeks ago. She was sitting down when she suddenly noticed this feeling. She had no injury that precipitated this current episode. She describes the sensation of both her feet as almost falling asleep. It is worse when she is sitting but is not present when lying down supine.  She does not have any pain or numbness.   She also has some abdominal pain and blood in her stool that she is concerned about. She has an appointment to see GI in August but is wondering if she can been seen sooner. She has no pain with bowel movements but does notice streaks of blood on her stool a few times each week. She also has right lower quadrant pain that bothers her occasionally. She denies fever, night sweats, or unintentional weight loss.      Objective:    BP 106/72   Pulse (!) 109   Ht 5' (1.524 m)   Wt 152 lb (68.9 kg)   SpO2 99%   BMI 29.69 kg/m  BP Readings from Last 3 Encounters:  10/29/23 106/72  08/06/23 116/64  06/22/23 105/71   Wt Readings from Last 3 Encounters:  10/29/23 152 lb (68.9 kg)  08/06/23 147 lb 14.4 oz (67.1 kg)  06/22/23 148 lb (67.1 kg)    Physical Exam Constitutional:      General: She is not in acute distress.    Appearance: Normal appearance.  HENT:     Head: Normocephalic and atraumatic.     Right Ear: External ear normal.     Left Ear: External ear normal.     Nose: Nose normal.     Mouth/Throat:     Mouth: Mucous membranes are moist.     Pharynx: Oropharynx is clear. No oropharyngeal exudate.    Eyes:     General: No scleral icterus.    Extraocular Movements: Extraocular movements intact.     Conjunctiva/sclera: Conjunctivae normal.     Pupils: Pupils are equal, round, and reactive to light.    Cardiovascular:     Rate and Rhythm: Normal rate and regular rhythm.     Pulses: Normal pulses.     Heart sounds: No murmur heard.    No friction rub. No gallop.  Pulmonary:     Effort: Pulmonary effort is normal. No respiratory distress.     Breath sounds: Normal breath sounds.  Abdominal:     General: Abdomen is flat. There is no distension.     Palpations: Abdomen is soft. There is no mass.     Tenderness: There is abdominal tenderness. There is no right CVA tenderness, left CVA tenderness, guarding or rebound.     Hernia: No hernia is present.     Comments: RLQ and LLQ tenderness to deep palpation   Musculoskeletal:     Cervical back: Normal range of motion.   Neurological:     Mental Status: She is alert.     No results  found for any visits on 10/29/23.      Assessment & Plan:   Problem List Items Addressed This Visit       Other   RLQ abdominal pain   Patient is having intermittent RLQ pain and is also having blood in some of her stools. Denies fevers or unintentional weight loss. Is not feeling short of breath. She has been referred to GI and has an appointment for August. We are going to check her CBC today to see if she has become anemic at all to see if she needs to be seen more expediently by GI. -CBC ordered -Referral sent to [place] for GI to see if they have any sooner appointments      Relevant Orders   Ambulatory referral to Gastroenterology   Paresthesia of foot, bilateral - Primary   Patient is having bilateral foot burning/tingling of uncertain etiology. She denies any dietary changes, neurologic back pain, vascular symptoms, or history of diabetes. She has a reassuring physical exam with normal reflexes. She denies having issues controlling bowel or  bladder function. We discussed that vitamin deficiencies and anemia can cause paresthesias at times and we can check those to assess for any abnormalities. -B12, B9, Vitamin E, CMET ordered -will follow up with PCP for physical -Will call patient if any abnormal results arise      Relevant Orders   CBC w/Diff/Platelet   B12 and Folate Panel   Vitamin E   Comprehensive Metabolic Panel (CMET)   Hemoglobin A1c   Other Visit Diagnoses       Hyperglycemia       Relevant Orders   Hemoglobin A1c     Blood in stool       Relevant Orders   Ambulatory referral to Gastroenterology       No orders of the defined types were placed in this encounter.   Return in about 2 months (around 12/29/2023) for CPE.  Leonor JAYSON Clause, Medical Student   Patient seen along with MS3 student, Spurgeon Clause. I personally evaluated this patient along with the student, and verified all aspects of the history, physical exam, and medical decision making as documented by the student. I agree with the student's documentation and have made all necessary edits.  Ader Fritze, Jon HERO, MD, MPH [facility]

## 2023-10-30 ENCOUNTER — Ambulatory Visit: Payer: Self-pay | Admitting: Family Medicine

## 2023-10-30 DIAGNOSIS — R202 Paresthesia of skin: Secondary | ICD-10-CM

## 2023-10-30 LAB — VITAMIN E

## 2023-11-06 LAB — CBC WITH DIFFERENTIAL/PLATELET
Basophils Absolute: 0.1 10*3/uL (ref 0.0–0.2)
Basos: 1 %
EOS (ABSOLUTE): 0.1 10*3/uL (ref 0.0–0.4)
Eos: 1 %
Hematocrit: 35.4 % (ref 34.0–46.6)
Hemoglobin: 11.9 g/dL (ref 11.1–15.9)
Immature Grans (Abs): 0 10*3/uL (ref 0.0–0.1)
Immature Granulocytes: 0 %
Lymphocytes Absolute: 2 10*3/uL (ref 0.7–3.1)
Lymphs: 25 %
MCH: 29.9 pg (ref 26.6–33.0)
MCHC: 33.6 g/dL (ref 31.5–35.7)
MCV: 89 fL (ref 79–97)
Monocytes Absolute: 0.7 10*3/uL (ref 0.1–0.9)
Monocytes: 9 %
Neutrophils Absolute: 5.2 10*3/uL (ref 1.4–7.0)
Neutrophils: 64 %
Platelets: 368 10*3/uL (ref 150–450)
RBC: 3.98 x10E6/uL (ref 3.77–5.28)
RDW: 11.9 % (ref 11.7–15.4)
WBC: 8 10*3/uL (ref 3.4–10.8)

## 2023-11-06 LAB — COMPREHENSIVE METABOLIC PANEL WITH GFR
ALT: 28 IU/L (ref 0–32)
AST: 15 IU/L (ref 0–40)
Albumin: 4.4 g/dL (ref 4.0–5.0)
Alkaline Phosphatase: 73 IU/L (ref 44–121)
BUN/Creatinine Ratio: 21 (ref 9–23)
BUN: 14 mg/dL (ref 6–20)
Bilirubin Total: 0.5 mg/dL (ref 0.0–1.2)
CO2: 22 mmol/L (ref 20–29)
Calcium: 9.3 mg/dL (ref 8.7–10.2)
Chloride: 103 mmol/L (ref 96–106)
Creatinine, Ser: 0.66 mg/dL (ref 0.57–1.00)
Globulin, Total: 2.6 g/dL (ref 1.5–4.5)
Glucose: 103 mg/dL — ABNORMAL HIGH (ref 70–99)
Potassium: 3.8 mmol/L (ref 3.5–5.2)
Sodium: 140 mmol/L (ref 134–144)
Total Protein: 7 g/dL (ref 6.0–8.5)
eGFR: 121 mL/min/{1.73_m2} (ref >59)

## 2023-11-06 LAB — HEMOGLOBIN A1C
Est. average glucose Bld gHb Est-mCnc: 103 mg/dL
Hgb A1c MFr Bld: 5.2 % (ref 4.8–5.6)

## 2023-11-06 LAB — B12 AND FOLATE PANEL
Folate: 8.2 ng/mL (ref >3.0)
Vitamin B-12: 492 pg/mL (ref 232–1245)

## 2023-11-06 NOTE — Telephone Encounter (Signed)
 Ok to place referral to neurology. We discussed during her visit

## 2023-11-08 ENCOUNTER — Encounter: Payer: Self-pay | Admitting: Neurology

## 2023-11-08 NOTE — Telephone Encounter (Signed)
 Paresthesia of foot, bilateral (used in last note if that helps)

## 2023-12-31 ENCOUNTER — Ambulatory Visit: Payer: Self-pay | Admitting: Family Medicine

## 2023-12-31 ENCOUNTER — Ambulatory Visit: Admitting: Family Medicine

## 2023-12-31 ENCOUNTER — Encounter: Payer: Self-pay | Admitting: Family Medicine

## 2023-12-31 VITALS — BP 103/76 | HR 82 | Temp 97.2°F | Ht 60.0 in | Wt 152.7 lb

## 2023-12-31 DIAGNOSIS — H65191 Other acute nonsuppurative otitis media, right ear: Secondary | ICD-10-CM | POA: Diagnosis not present

## 2023-12-31 DIAGNOSIS — J029 Acute pharyngitis, unspecified: Secondary | ICD-10-CM

## 2023-12-31 DIAGNOSIS — Z0001 Encounter for general adult medical examination with abnormal findings: Secondary | ICD-10-CM

## 2023-12-31 LAB — POC COVID19/FLU A&B COMBO
Covid Antigen, POC: NEGATIVE
Influenza A Antigen, POC: NEGATIVE
Influenza B Antigen, POC: NEGATIVE

## 2023-12-31 LAB — POCT RAPID STREP A (OFFICE): Rapid Strep A Screen: NEGATIVE

## 2023-12-31 MED ORDER — AMOXICILLIN-POT CLAVULANATE 875-125 MG PO TABS: 1.0000 | ORAL_TABLET | Freq: Two times a day (BID) | ORAL | 0 refills | Status: AC

## 2023-12-31 MED ORDER — PREDNISONE 10 MG PO TABS: ORAL_TABLET | ORAL | 0 refills | Status: AC

## 2023-12-31 MED ORDER — FLUTICASONE PROPIONATE 50 MCG/ACT NA SUSP: 2.0000 | Freq: Every day | NASAL | 0 refills | Status: AC

## 2023-12-31 NOTE — Progress Notes (Signed)
 Complete physical exam  Patient: Kaitlin Parks   DOB: June 16, 1995   28 y.o. Female  MRN: [id number]  Introduced to nurse practitioner role and practice setting.  All questions answered.  Discussed provider/patient relationship and expectations.  Subjective:    Chief Complaint  Patient presents with   Sore Throat    Patient reports sore throat congestion x1 day. States she has had post nasal drip ongoing for a few weeks.     Kaitlin Parks is a 28 y.o. female who presents today for a complete physical exam. She reports consuming a general diet. The patient does not participate in regular exercise at present. She generally feels fairly well. She reports sleeping well. She does not have additional problems to discuss today.   Discussed the use of AI scribe software for clinical note transcription with the patient, who gave verbal consent to proceed.  History of Present Illness Kaitlin Parks is a 28 year old female who presents for a general physical exam and evaluation of postnasal drip four weeks   She has been experiencing postnasal drip for approximately four weeks, described as a sensation of something stuck in her throat, occasionally requiring her to clear it. No medication has been taken for this issue. No fever or body chills. Her tonsils feel enlarged. Feels like she is constantly clearing her throat/nose.   She recently underwent a colonoscopy on July 31st due to right lower quadrant pain or aching, which returned normal results per patient. A follow-up appointment with the gastroenterologist is scheduled for next week on 01/10/24  She has a neurology appointment scheduled for September 4th with Dr. Venetia Potters in [place] for bilateral foot numbness work up.  Her current medications include Kyleena,IUD. She has no known allergies to antibiotics. She does not adhere to any specific diet and does not engage in regular exercise. Her sleep is  described as good. She has two children, aged 3 and seven.  Most recent fall risk assessment:    12/31/2023    9:36 AM  Fall Risk   Falls in the past year? 0  Number falls in past yr: 0  Injury with Fall? 0  Risk for fall due to : No Fall Risks  Follow up Falls evaluation completed     Most recent depression screenings:    12/31/2023    9:36 AM 10/29/2023    1:17 PM  PHQ 2/9 Scores  PHQ - 2 Score 0 0  PHQ- 9 Score 0 1      12/31/2023    9:36 AM 10/29/2023    1:17 PM 08/06/2023    8:42 AM 06/22/2023    9:03 AM  GAD 7 : Generalized Anxiety Score  Nervous, Anxious, on Edge 0 0 0 0  Control/stop worrying 0 0 0 0  Worry too much - different things 0 0 0 0  Trouble relaxing 0 0 0 0  Restless 0 0 0 0  Easily annoyed or irritable 1 0 0 0  Afraid - awful might happen 0 0 0 0  Total GAD 7 Score 1 0 0 0  Anxiety Difficulty Not difficult at all Not difficult at all Not difficult at all Not difficult at all      Vision:Within last year and Dental: No current dental problems  Patient Active Problem List   Diagnosis Date Noted   Paresthesia of foot, bilateral 10/29/2023   RLQ abdominal pain 06/23/2023   Chronic constipation 06/23/2023   Non-recurrent acute suppurative  otitis media of right ear without spontaneous rupture of tympanic membrane 03/17/2022   Enlarged tonsils 03/17/2022   Fever blister 03/17/2022   History reviewed. No pertinent past medical history. Past Surgical History:  Procedure Laterality Date   NO PAST SURGERIES     Social History   Tobacco Use   Smoking status: Never   Smokeless tobacco: Never  Vaping Use   Vaping status: Never Used  Substance Use Topics   Alcohol use: No   Drug use: No   No Known Allergies    Patient Care Team: Ostwalt, Janna, PA-C as PCP - General (Physician Assistant)   Outpatient Medications Prior to Visit  Medication Sig   levonorgestrel (KYLEENA) 19.5 MG IUD 1 each by Intrauterine route once.   triamcinolone  cream  (KENALOG ) 0.1 % Apply 1 Application topically 2 (two) times daily. (Patient not taking: Reported on 12/31/2023)   No facility-administered medications prior to visit.    ROS        Objective:     BP 103/76 (BP Location: Left Arm, Patient Position: Sitting, Cuff Size: Normal)   Pulse 82   Temp (!) 97.2 F (36.2 C) (Oral)   Ht 5' (1.524 m)   Wt 152 lb 11.2 oz (69.3 kg)   SpO2 98%   BMI 29.82 kg/m  BP Readings from Last 3 Encounters:  12/31/23 103/76  10/29/23 106/72  08/06/23 116/64   Wt Readings from Last 3 Encounters:  12/31/23 152 lb 11.2 oz (69.3 kg)  10/29/23 152 lb (68.9 kg)  08/06/23 147 lb 14.4 oz (67.1 kg)      Physical Exam Constitutional:      General: She is not in acute distress.    Appearance: Normal appearance. She is normal weight. She is not ill-appearing.  HENT:     Head: Normocephalic.     Right Ear: No tenderness. A middle ear effusion is present.     Left Ear: Tympanic membrane normal. No tenderness.     Nose: Congestion and rhinorrhea present.     Right Turbinates: Enlarged and swollen.     Left Turbinates: Enlarged and swollen.     Right Sinus: No maxillary sinus tenderness or frontal sinus tenderness.     Left Sinus: No maxillary sinus tenderness or frontal sinus tenderness.     Mouth/Throat:     Mouth: Mucous membranes are moist.     Pharynx: Pharyngeal swelling present. No oropharyngeal exudate or posterior oropharyngeal erythema.     Tonsils: No tonsillar exudate. 2+ on the right. 2+ on the left.  Eyes:     General: Lids are normal.     Extraocular Movements: Extraocular movements intact.     Right eye: Normal extraocular motion.     Left eye: Normal extraocular motion.     Conjunctiva/sclera: Conjunctivae normal.     Right eye: Right conjunctiva is not injected.     Left eye: Left conjunctiva is not injected.     Pupils: Pupils are equal, round, and reactive to light.  Neck:     Thyroid: No thyroid mass, thyromegaly or thyroid  tenderness.  Cardiovascular:     Rate and Rhythm: Normal rate and regular rhythm.     Pulses: Normal pulses.          Radial pulses are 2+ on the right side and 2+ on the left side.       Posterior tibial pulses are 2+ on the right side and 2+ on the left side.  Heart sounds: Normal heart sounds, S1 normal and S2 normal. No murmur heard.    No friction rub. No gallop.  Pulmonary:     Effort: Pulmonary effort is normal. No respiratory distress.     Breath sounds: Normal breath sounds. No stridor. No wheezing, rhonchi or rales.  Chest:     Comments: No breast concerns, not assessed Abdominal:     General: Bowel sounds are normal. There is no distension.     Palpations: Abdomen is soft. There is no mass.     Tenderness: There is no abdominal tenderness. There is no guarding or rebound.     Hernia: No hernia is present.  Genitourinary:    Comments: No concerns, not assessed today. Musculoskeletal:        General: No swelling or tenderness. Normal range of motion.     Cervical back: Normal range of motion. No rigidity.  Lymphadenopathy:     Cervical: No cervical adenopathy.     Right cervical: No superficial, deep or posterior cervical adenopathy.    Left cervical: No superficial, deep or posterior cervical adenopathy.  Skin:    General: Skin is warm and dry.     Capillary Refill: Capillary refill takes less than 2 seconds.     Findings: No bruising or erythema.  Neurological:     General: No focal deficit present.     Mental Status: She is alert and oriented to person, place, and time. Mental status is at baseline.     GCS: GCS eye subscore is 4. GCS verbal subscore is 5. GCS motor subscore is 6.     Cranial Nerves: No cranial nerve deficit.     Sensory: No sensory deficit.     Motor: No weakness, tremor or pronator drift.     Coordination: Romberg sign negative.     Gait: Gait is intact. Gait normal.  Psychiatric:        Attention and Perception: Attention and perception  normal.        Mood and Affect: Mood and affect normal.        Speech: Speech normal.        Behavior: Behavior normal. Behavior is cooperative.        Thought Content: Thought content normal.        Cognition and Memory: Cognition and memory normal.        Judgment: Judgment normal.      Results for orders placed or performed in visit on 12/31/23  POC Covid19/Flu A&B Antigen  Result Value Ref Range   Influenza A Antigen, POC Negative Negative   Influenza B Antigen, POC Negative Negative   Covid Antigen, POC Negative Negative  POCT rapid strep A  Result Value Ref Range   Rapid Strep A Screen Negative Negative       Assessment & Plan:    Routine Health Maintenance and Physical Exam  Assessment and Plan Assessment & Plan Acute upper respiratory infection with postnasal drip and pharyngitis - Negative COVID, Flu, Rapid Strep. Postnasal drip and pharyngitis ongoing for four weeks. No fever or chills. Examination reveals fluid behind the ear, not infectious, and swollen tonsils. Symptoms suggestive of bacterial infection given the duration, warranting antibiotic treatment. Prednisone  prescribed to address tonsillar swelling. - Prescribe Augmentin  for 7 days, twice daily. - Prescribe Flonase , use twice daily. - Recommend over-the-counter nasal saline spray. - Recommend over-the-counter Mucinex, ensure adequate hydration due to drying effect. - Advise gargling with salt water and drinking hot tea with honey. -  Prescribe a 6-day taper of prednisone  to reduce tonsillar swelling. - May take tylenol  for headache mgmt, avoid nsaids while on steroid.   Annual Physical  - Things to do to keep yourself healthy  - Exercise at least 30-45 minutes a day, 3-4 days a week.  - Eat a low-fat diet with lots of fruits and vegetables, up to 7-9 servings per day.  - Seatbelts can save your life. Wear them always.  - Smoke detectors on every level of your home, check batteries every year.  - Eye  Doctor - have an eye exam every 1-2 years  - Safe sex - if you may be exposed to STDs, use a condom.  - Alcohol -  If you drink, do it moderately, less than 2 drinks per day.  - Health Care Power of Attorney. Choose someone to speak for you if you are not able.  - Depression is common in our stressful world.If you're feeling down or losing interest in things you normally enjoy, please come in for a visit.  - Violence - If anyone is threatening or hurting you, please call immediately.  -Take Multi vitamin - Last PAP - 01/2020 - Recommend scheduling a PAP with IUD string check - Recommend HIV and Hep C screening - Recommend Tdap next visit, when not feeling ill like today - Recommend Flu vaccine for flu season  Health Maintenance  Topic Date Due   HIV Screening  Never done   Hepatitis C Screening  Never done   DTaP/Tdap/Td vaccine (1 - Tdap) Never done   Hepatitis B Vaccine (1 of 3 - 19+ 3-dose series) Never done   HPV Vaccine (1 - 3-dose SCDM series) Never done   COVID-19 Vaccine (3 - 2024-25 season) 01/07/2023   Pap with HPV screening  Never done   Flu Shot  12/07/2023   Pneumococcal Vaccine  Aged Out   Meningitis B Vaccine  Aged Out    Discussed health benefits of physical activity, and encouraged her to engage in regular exercise appropriate for her age and condition.  Sore throat -     POC Covid19/Flu A&B Antigen -     POCT rapid strep A -     predniSONE ; Take 60mg  PO daily x1d, then 50mg  daily x1d, then 40mg  daily x1d, then 30mg  daily x1d, then 20mg  daily x1d, then 10mg  daily x1d, then stop  Dispense: 21 tablet; Refill: 0 -     Amoxicillin -Pot Clavulanate; Take 1 tablet by mouth 2 (two) times daily.  Dispense: 14 tablet; Refill: 0  Acute effusion of right ear -     Fluticasone  Propionate; Place 2 sprays into both nostrils daily.  Dispense: 16 g; Refill: 0     Return for annual physical.     Curtis DELENA Boom, FNP

## 2024-01-02 NOTE — Progress Notes (Signed)
 Initial neurology clinic note  Reason for Evaluation: Consultation requested by Myrla Jon HERO, MD for an opinion regarding burning in both feet. My final recommendations will be communicated back to the requesting physician by way of shared medical record or letter to requesting physician via [place]  mail.  HPI: This is Ms. Kaitlin Parks, a 28 y.o. right-handed female with no significant medical history who presents to neurology clinic with the chief complaint of burning in both feet. The patient is alone today.  Patient first noticed burning in her feet in May or June of 2025 while she was on her feet. It would come and go. She will cross her legs and sit on them frequently. Recently she has noticed her legs will go to sleep very quickly now. When she is driving she can also notice her right toes feel funny. She thinks wearing sandals will make it occur less. If she sits in a chair with a short seat, it can press behind her leg and make symptoms worse. She has occasional back pain but nothing that radiates into her legs. She has not noticed any weakness in her legs. She does not typically get symptoms in arms but did mention a time when her arms would tingling when holding it up. This only occurs when leaning on her elbow.   She denies saddle anesthesia.  She has never had previous symptoms.  The patient does not report symptoms referable to autonomic dysfunction including impaired sweating, heat or cold intolerance, excessive mucosal dryness, gastroparetic early satiety, postprandial abdominal bloating, constipation, bowel or bladder dyscontrol, or syncope/presyncope/orthostatic intolerance.  Of note, patient sees GI for abdominal pain and blood in her stool. Work up to date has been normal.  She does not report any constitutional symptoms like fever, night sweats, anorexia or unintentional weight loss.  EtOH use: 1 drink a month  Restrictive diet? No Family history of  neuropathy/myopathy/neurologic disease? No   MEDICATIONS:  Outpatient Encounter Medications as of 01/10/2024  Medication Sig   levonorgestrel (KYLEENA) 19.5 MG IUD 1 each by Intrauterine route once.   amoxicillin -clavulanate (AUGMENTIN ) 875-125 MG tablet Take 1 tablet by mouth 2 (two) times daily. (Patient not taking: Reported on 01/10/2024)   fluticasone  (FLONASE ) 50 MCG/ACT nasal spray Place 2 sprays into both nostrils daily. (Patient not taking: Reported on 01/10/2024)   predniSONE  (DELTASONE ) 10 MG tablet Take 60mg  PO daily x1d, then 50mg  daily x1d, then 40mg  daily x1d, then 30mg  daily x1d, then 20mg  daily x1d, then 10mg  daily x1d, then stop (Patient not taking: Reported on 01/10/2024)   triamcinolone  cream (KENALOG ) 0.1 % Apply 1 Application topically 2 (two) times daily. (Patient not taking: Reported on 01/10/2024)   No facility-administered encounter medications on file as of 01/10/2024.    PAST MEDICAL HISTORY: History reviewed. No pertinent past medical history.  PAST SURGICAL HISTORY: Past Surgical History:  Procedure Laterality Date   NO PAST SURGERIES      ALLERGIES: No Known Allergies  FAMILY HISTORY: Family History  Problem Relation Age of Onset   Healthy Mother    Healthy Father     SOCIAL HISTORY: Social History   Tobacco Use   Smoking status: Never   Smokeless tobacco: Never  Vaping Use   Vaping status: Never Used  Substance Use Topics   Alcohol use: Yes    Comment: rarely   Drug use: No   Social History   Social History Narrative   Are you right handed or left handed? right  Are you currently employed ?    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   Who lives with you? family   What type of home do you live in: 1 story or 2 story? one         OBJECTIVE: PHYSICAL EXAM: BP 109/75   Pulse 88   Ht 5' (1.524 m)   Wt 153 lb (69.4 kg)   SpO2 100%   BMI 29.88 kg/m   General: General appearance: Awake and alert. No  distress. Cooperative with exam.  Skin: No obvious rash or jaundice. HEENT: Atraumatic. Anicteric. Lungs: Non-labored breathing on room air  Heart: Regular Extremities: No edema.  Psych: Affect appropriate.  Neurological: Mental Status: Alert. Speech fluent. No pseudobulbar affect Cranial Nerves: CNII: No RAPD. Visual fields grossly intact. CNIII, IV, VI: PERRL. No nystagmus. EOMI. CN V: Facial sensation intact bilaterally to fine touch. CN VII: Facial muscles symmetric and strong. No ptosis at rest. CN VIII: Hearing grossly intact bilaterally. CN IX: No hypophonia. CN X: Palate elevates symmetrically. CN XI: Full strength shoulder shrug bilaterally. CN XII: Tongue protrusion full and midline. No atrophy or fasciculations. No significant dysarthria Motor: Tone is normal. No atrophy.  Individual muscle group testing (MRC grade out of 5):  Movement     Neck flexion 5    Neck extension 5     Right Left   Shoulder abduction 5 5   Elbow flexion 5 5   Elbow extension 5 5   Wrist extension 5 5   Wrist flexion 5 5   Finger abduction - FDI 5 5   Finger abduction - ADM 5 5   Finger extension 5 5   Finger distal flexion - 2/3 5 5    Finger distal flexion - 4/5 5 5    Thumb flexion - FPL 5 5   Thumb abduction - APB 5 5    Hip flexion 5 5   Hip extension 5 5   Hip adduction 5 5   Hip abduction 5 5   Knee extension 5 5   Knee flexion 5 5   Dorsiflexion 5 5   Plantarflexion 5 5   Inversion 5 5   Eversion 5 5   Great toe extension 5 5   Great toe flexion 5 5     Reflexes:  Right Left   Bicep 2+ 2+   Tricep 2+ 2+   BrRad 2+ 2+   Knee 2+ 2+   Ankle 1+ 1+    Pathological Reflexes: Babinski: mute response bilaterally Hoffman: absent bilaterally Troemner: absent bilaterally Sensation: Pinprick: Intact in all extremities Vibration: Intact in all extremities Proprioception: Intact in bilateral great toes Coordination: Intact finger-to- nose-finger bilaterally.  Romberg negative. Gait: Able to rise from chair with arms crossed unassisted. Normal, narrow-based gait. Able to tandem walk. Able to walk on toes and heels.  Lab and Test Review: Internal labs: 10/29/23: HbA1c: 5.2 Vit E: low at 5.8 B12: 492 Folate wnl CBC w/ diff unremarkable CMP significant for glucose 103  TSH (08/09/17) wnl Lipid panel (08/09/17): tChol 103, LDL 51, TG 33  ASSESSMENT: Kaitlin Parks is a 28 y.o. female who presents for evaluation of numbness, tingling, and burning of feet and arms. She has no relevant medical history. Her neurological examination is normal today. The most likely etiology of symptoms is nerve compression from crossing legs (peroneal nerve most likely) and leaning on elbows (ulnar nerve). We discussed behavior modification to  prevent nerve compression and worsening. We also discussed work up with EMG but patient prefers to monitor with behavior modification and then do EMG if symptoms do not improve.  PLAN: -Recommend not crossing legs or leaning on elbows -Discussed EMG, patient prefers to monitor symptoms for now -Patient to call with worsening symptoms  -Return to clinic in about 3 months  The impression above as well as the plan as outlined below were extensively discussed with the patient who voiced understanding. All questions were answered to their satisfaction.  When available, results of the above investigations and possible further recommendations will be communicated to the patient via telephone/MyChart. Patient to call office if not contacted after expected testing turnaround time.   Total time spent reviewing records, interview, history/exam, documentation, and coordination of care on day of encounter:  45 min   Thank you for allowing me to participate in patient's care.  If I can answer any additional questions, I would be pleased to do so.  Venetia Potters, MD   CC: Dineen Channel, PA-C [address] [place]  [place] [address]  CC: Referring provider: Myrla Jon HERO, MD [address] [place],  [place] [address]

## 2024-01-10 ENCOUNTER — Encounter: Payer: Self-pay | Admitting: Neurology

## 2024-01-10 ENCOUNTER — Ambulatory Visit (INDEPENDENT_AMBULATORY_CARE_PROVIDER_SITE_OTHER): Admitting: Neurology

## 2024-01-10 VITALS — BP 109/75 | HR 88 | Ht 60.0 in | Wt 153.0 lb

## 2024-01-10 DIAGNOSIS — R202 Paresthesia of skin: Secondary | ICD-10-CM

## 2024-01-10 DIAGNOSIS — R200 Anesthesia of skin: Secondary | ICD-10-CM

## 2024-01-10 NOTE — Patient Instructions (Signed)
 I saw you today for burning/tingling in feet and arms. I think this is probably due to nerves being compressed and being unhappy due to crossing and sitting on your legs or leaning on your elbows.  We discussed not doing this anymore. We could also do nerve testing if symptoms do not get better after stopping crossing your legs and leaning on elbows. Let me know if symptoms get worse and we will do the nerve testing called EMG (see information below).  I will see you back in clinic in about 3 months to check in. Please let me know if you have any questions or concerns in the meantime.  The physicians and staff at [facility] are committed to providing excellent care. You may receive a survey requesting feedback about your experience at our office. We strive to receive very good responses to the survey questions. If you feel that your experience would prevent you from giving the office a very good  response, please contact our office to try to remedy the situation. We may be reached at [phone]. Thank you for taking the time out of your busy day to complete the survey.  Venetia Potters, MD [facility]  ELECTROMYOGRAM AND NERVE CONDUCTION STUDIES (EMG/NCS) INSTRUCTIONS  How to Prepare The neurologist conducting the EMG will need to know if you have certain medical conditions. Tell the neurologist and other EMG lab personnel if you: Have a pacemaker or any other electrical medical device Take blood-thinning medications Have hemophilia, a blood-clotting disorder that causes prolonged bleeding Bathing Take a shower or bath shortly before your exam in order to remove oils from your skin. Don't apply lotions or creams before the exam.  What to Expect You'll likely be asked to change into a hospital gown for the procedure and lie down on an examination table. The following explanations can help you understand what will happen during the exam.  Electrodes. The neurologist or a technician  places surface electrodes at various locations on your skin depending on where you're experiencing symptoms. Or the neurologist may insert needle electrodes at different sites depending on your symptoms.  Sensations. The electrodes will at times transmit a tiny electrical current that you may feel as a twinge or spasm. The needle electrode may cause discomfort or pain that usually ends shortly after the needle is removed. If you are concerned about discomfort or pain, you may want to talk to the neurologist about taking a short break during the exam.  Instructions. During the needle EMG, the neurologist will assess whether there is any spontaneous electrical activity when the muscle is at rest - activity that isn't present in healthy muscle tissue - and the degree of activity when you slightly contract the muscle.  He or she will give you instructions on resting and contracting a muscle at appropriate times. Depending on what muscles and nerves the neurologist is examining, he or she may ask you to change positions during the exam.  After your EMG You may experience some temporary, minor bruising where the needle electrode was inserted into your muscle. This bruising should fade within several days. If it persists, contact your primary care doctor.

## 2024-05-15 NOTE — Progress Notes (Unsigned)
 "  I saw Kaitlin Parks in neurology clinic on 01/10/24 in follow up for numbness, tingling, and burning in feet and arms.  HPI: Kaitlin Parks is a 29 y.o. year old female with no significant medical history who we last saw on 01/10/24.  To briefly review: 01/10/24: Patient first noticed burning in her feet in May or June of 2025 while she was on her feet. It would come and go. She will cross her legs and sit on them frequently. Recently she has noticed her legs will go to sleep very quickly now. When she is driving she can also notice her right toes feel funny. She thinks wearing sandals will make it occur less. If she sits in a chair with a short seat, it can press behind her leg and make symptoms worse. She has occasional back pain but nothing that radiates into her legs. She has not noticed any weakness in her legs. She does not typically get symptoms in arms but did mention a time when her arms would tingle when holding it up. This only occurs when leaning on her elbow.    She denies saddle anesthesia.   She has never had previous symptoms.   The patient does not report symptoms referable to autonomic dysfunction including impaired sweating, heat or cold intolerance, excessive mucosal dryness, gastroparetic early satiety, postprandial abdominal bloating, constipation, bowel or bladder dyscontrol, or syncope/presyncope/orthostatic intolerance.   Of note, patient sees GI for abdominal pain and blood in her stool. Work up to date has been normal.   She does not report any constitutional symptoms like fever, night sweats, anorexia or unintentional weight loss.   EtOH use: 1 drink a month  Restrictive diet? No Family history of neuropathy/myopathy/neurologic disease? No  Most recent Assessment and Plan (01/10/24): Kaitlin Parks is a 29 y.o. female who presents for evaluation of numbness, tingling, and burning of feet and arms. She has no relevant medical history.  Her neurological examination is normal today. The most likely etiology of symptoms is nerve compression from crossing legs (peroneal nerve most likely) and leaning on elbows (ulnar nerve). We discussed behavior modification to prevent nerve compression and worsening. We also discussed work up with EMG but patient prefers to monitor with behavior modification and then do EMG if symptoms do not improve.   PLAN: -Recommend not crossing legs or leaning on elbows -Discussed EMG, patient prefers to monitor symptoms for now -Patient to call with worsening symptoms  Since their last visit: ***  ROS: Pertinent positive and negative systems reviewed in HPI. ***   MEDICATIONS:  Outpatient Encounter Medications as of 05/23/2024  Medication Sig   amoxicillin -clavulanate (AUGMENTIN ) 875-125 MG tablet Take 1 tablet by mouth 2 (two) times daily. (Patient not taking: Reported on 01/10/2024)   fluticasone  (FLONASE ) 50 MCG/ACT nasal spray Place 2 sprays into both nostrils daily. (Patient not taking: Reported on 01/10/2024)   levonorgestrel (KYLEENA) 19.5 MG IUD 1 each by Intrauterine route once.   predniSONE  (DELTASONE ) 10 MG tablet Take 60mg  PO daily x1d, then 50mg  daily x1d, then 40mg  daily x1d, then 30mg  daily x1d, then 20mg  daily x1d, then 10mg  daily x1d, then stop (Patient not taking: Reported on 01/10/2024)   triamcinolone  cream (KENALOG ) 0.1 % Apply 1 Application topically 2 (two) times daily. (Patient not taking: Reported on 01/10/2024)   No facility-administered encounter medications on file as of 05/23/2024.    PAST MEDICAL HISTORY: No past medical history on file.  PAST SURGICAL HISTORY: Past Surgical  History:  Procedure Laterality Date   NO PAST SURGERIES      ALLERGIES: Allergies[1]  FAMILY HISTORY: Family History  Problem Relation Age of Onset   Healthy Mother    Healthy Father     SOCIAL HISTORY: Social History[2] Social History   Social History Narrative   Are you right handed or  left handed? right   Are you currently employed ?    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   Who lives with you? family   What type of home do you live in: 1 story or 2 story? one        Objective:  Vital Signs:  There were no vitals taken for this visit.  General:*** General appearance: Awake and alert. No distress. Cooperative with exam.  Skin: No obvious rash or jaundice. HEENT: Atraumatic. Anicteric. Lungs: Non-labored breathing on room air  Heart: Regular Abdomen: Soft, non tender. Extremities: No edema. No obvious deformity.  Musculoskeletal: No obvious joint swelling.  Neurological: Mental Status: Alert. Speech fluent. No pseudobulbar affect Cranial Nerves: CNII: No RAPD. Visual fields intact. CNIII, IV, VI: PERRL. No nystagmus. EOMI. CN V: Facial sensation intact bilaterally to fine touch. [place] clench strong. Jaw jerk***. CN VII: Facial muscles symmetric and strong. No ptosis at rest or after sustained upgaze***. CN VIII: Hears finger rub well bilaterally. CN IX: No hypophonia. CN X: Palate elevates symmetrically. CN XI: Full strength shoulder shrug bilaterally. CN XII: Tongue protrusion full and midline. No atrophy or fasciculations. No significant dysarthria*** Motor: Tone is ***. *** fasciculations in *** extremities. *** atrophy. No grip or percussive myotonia.  Individual muscle group testing (MRC grade out of 5):  Movement     Neck flexion ***    Neck extension ***     Right Left   Shoulder abduction *** ***   Shoulder adduction *** ***   Shoulder ext rotation *** ***   Shoulder int rotation *** ***   Elbow flexion *** ***   Elbow extension *** ***   Wrist extension *** ***   Wrist flexion *** ***   Finger abduction - FDI *** ***   Finger abduction - ADM *** ***   Finger extension *** ***   Finger distal flexion - 2/3 *** ***   Finger distal flexion - 4/5 *** ***   Thumb flexion - FPL *** ***    Thumb abduction - APB *** ***    Hip flexion *** ***   Hip extension *** ***   Hip adduction *** ***   Hip abduction *** ***   Knee extension *** ***   Knee flexion *** ***   Dorsiflexion *** ***   Plantarflexion *** ***   Inversion *** ***   Eversion *** ***   Great toe extension *** ***   Great toe flexion *** ***     Reflexes:  Right Left  Bicep *** ***  Tricep *** ***  BrRad *** ***  Knee *** ***  Ankle *** ***   Pathological Reflexes: Babinski: *** response bilaterally*** Hoffman: *** Troemner: *** Pectoral: *** Palmomental: *** Facial: *** Midline tap: *** Sensation: Pinprick: *** Vibration: *** Temperature: *** Proprioception: *** Coordination: Intact finger-to- nose-finger and heel-to-shin bilaterally. Romberg negative.*** Gait: Able to rise from chair with arms crossed unassisted. Normal, narrow-based gait. Able to tandem walk. Able to walk on toes and heels.***   Lab and Test Review: No new results***  Previously reviewed results: 10/29/23: HbA1c: 5.2  Vit E: low at 5.8 B12: 492 Folate wnl CBC w/ diff unremarkable CMP significant for glucose 103   TSH (08/09/17) wnl Lipid panel (08/09/17): tChol 103, LDL 51, TG 33  ASSESSMENT: This is Kaitlin Parks, a 29 y.o. female with:  ***  Plan: ***  Return to clinic in ***  Total time spent reviewing records, interview, history/exam, documentation, and coordination of care on day of encounter:  *** min  Venetia Potters, MD    [1] No Known Allergies [2]  Social History Tobacco Use   Smoking status: Never   Smokeless tobacco: Never  Vaping Use   Vaping status: Never Used  Substance Use Topics   Alcohol use: Yes    Comment: rarely   Drug use: No   "

## 2024-05-23 ENCOUNTER — Ambulatory Visit: Payer: Self-pay | Admitting: Neurology
